# Patient Record
Sex: Female | Born: 1945
Health system: Southern US, Community
[De-identification: ages and names within clinical notes are randomized; demographics above are authoritative.]

## PROBLEM LIST (undated history)

## (undated) DIAGNOSIS — K219 Gastro-esophageal reflux disease without esophagitis: Secondary | ICD-10-CM

## (undated) DIAGNOSIS — F419 Anxiety disorder, unspecified: Secondary | ICD-10-CM

## (undated) DIAGNOSIS — H269 Unspecified cataract: Secondary | ICD-10-CM

## (undated) DIAGNOSIS — F32A Depression, unspecified: Secondary | ICD-10-CM

## (undated) DIAGNOSIS — E785 Hyperlipidemia, unspecified: Secondary | ICD-10-CM

## (undated) DIAGNOSIS — E039 Hypothyroidism, unspecified: Secondary | ICD-10-CM

## (undated) DIAGNOSIS — F329 Major depressive disorder, single episode, unspecified: Secondary | ICD-10-CM

## (undated) HISTORY — PX: ABDOMINAL HYSTERECTOMY: SHX81

## (undated) HISTORY — PX: KNEE SURGERY: SHX244

## (undated) HISTORY — DX: Unspecified cataract: H26.9

## (undated) HISTORY — PX: CATARACT EXTRACTION: SUR2

---

## 2001-07-16 ENCOUNTER — Encounter: Payer: Self-pay | Admitting: Internal Medicine

## 2001-07-16 ENCOUNTER — Ambulatory Visit (HOSPITAL_COMMUNITY): Admission: RE | Admit: 2001-07-16 | Discharge: 2001-07-16 | Payer: Self-pay | Admitting: Internal Medicine

## 2001-12-19 ENCOUNTER — Other Ambulatory Visit: Admission: RE | Admit: 2001-12-19 | Discharge: 2001-12-19 | Payer: Self-pay | Admitting: Internal Medicine

## 2002-08-07 ENCOUNTER — Encounter: Payer: Self-pay | Admitting: Internal Medicine

## 2002-08-07 ENCOUNTER — Ambulatory Visit (HOSPITAL_COMMUNITY): Admission: RE | Admit: 2002-08-07 | Discharge: 2002-08-07 | Payer: Self-pay | Admitting: Internal Medicine

## 2002-08-08 ENCOUNTER — Ambulatory Visit (HOSPITAL_COMMUNITY): Admission: RE | Admit: 2002-08-08 | Discharge: 2002-08-08 | Payer: Self-pay | Admitting: Internal Medicine

## 2005-07-06 ENCOUNTER — Ambulatory Visit (HOSPITAL_COMMUNITY): Admission: RE | Admit: 2005-07-06 | Discharge: 2005-07-06 | Payer: Self-pay | Admitting: Internal Medicine

## 2005-10-18 ENCOUNTER — Emergency Department (HOSPITAL_COMMUNITY): Admission: EM | Admit: 2005-10-18 | Discharge: 2005-10-18 | Payer: Self-pay | Admitting: Emergency Medicine

## 2006-09-14 ENCOUNTER — Ambulatory Visit (HOSPITAL_COMMUNITY): Admission: RE | Admit: 2006-09-14 | Discharge: 2006-09-14 | Payer: Self-pay | Admitting: Internal Medicine

## 2006-10-03 ENCOUNTER — Ambulatory Visit (HOSPITAL_COMMUNITY): Admission: RE | Admit: 2006-10-03 | Discharge: 2006-10-03 | Payer: Self-pay | Admitting: Internal Medicine

## 2008-04-28 ENCOUNTER — Ambulatory Visit (HOSPITAL_COMMUNITY): Admission: RE | Admit: 2008-04-28 | Discharge: 2008-04-28 | Payer: Self-pay | Admitting: Internal Medicine

## 2008-05-04 ENCOUNTER — Ambulatory Visit (HOSPITAL_COMMUNITY): Admission: RE | Admit: 2008-05-04 | Discharge: 2008-05-04 | Payer: Self-pay | Admitting: Internal Medicine

## 2009-02-04 ENCOUNTER — Ambulatory Visit (HOSPITAL_COMMUNITY): Admission: RE | Admit: 2009-02-04 | Discharge: 2009-02-04 | Payer: Self-pay | Admitting: Internal Medicine

## 2009-05-12 ENCOUNTER — Ambulatory Visit (HOSPITAL_COMMUNITY): Admission: RE | Admit: 2009-05-12 | Discharge: 2009-05-12 | Payer: Self-pay | Admitting: Obstetrics and Gynecology

## 2009-06-21 ENCOUNTER — Encounter: Payer: Self-pay | Admitting: Obstetrics and Gynecology

## 2009-06-21 ENCOUNTER — Ambulatory Visit (HOSPITAL_COMMUNITY): Admission: RE | Admit: 2009-06-21 | Discharge: 2009-06-22 | Payer: Self-pay | Admitting: Obstetrics and Gynecology

## 2010-04-06 ENCOUNTER — Ambulatory Visit (HOSPITAL_COMMUNITY): Admission: RE | Admit: 2010-04-06 | Discharge: 2010-04-06 | Payer: Self-pay | Admitting: Obstetrics and Gynecology

## 2010-12-25 ENCOUNTER — Encounter: Payer: Self-pay | Admitting: Internal Medicine

## 2011-03-12 LAB — COMPREHENSIVE METABOLIC PANEL
AST: 24 U/L (ref 0–37)
Alkaline Phosphatase: 50 U/L (ref 39–117)
BUN: 11 mg/dL (ref 6–23)
CO2: 29 mEq/L (ref 19–32)
Calcium: 9.4 mg/dL (ref 8.4–10.5)
Chloride: 102 mEq/L (ref 96–112)
GFR calc Af Amer: >60 mL/min (ref >60)
GFR calc non Af Amer: >60 mL/min (ref >60)
Glucose, Bld: 89 mg/dL (ref 70–99)
Sodium: 138 mEq/L (ref 135–145)

## 2011-03-12 LAB — CBC
HCT: 35.9 % — ABNORMAL LOW (ref 36.0–46.0)
HCT: 37.3 % (ref 36.0–46.0)
Hemoglobin: 13 g/dL (ref 12.0–15.0)
MCV: 90.7 fL (ref 78.0–100.0)
MCV: 91.2 fL (ref 78.0–100.0)
Platelets: 215 10*3/uL (ref 150–400)
RBC: 3.96 MIL/uL (ref 3.87–5.11)
RDW: 12.3 % (ref 11.5–15.5)
WBC: 9.6 10*3/uL (ref 4.0–10.5)

## 2011-03-12 LAB — DIFFERENTIAL
Basophils Relative: 0 % (ref 0–1)
Eosinophils Absolute: 0 10*3/uL (ref 0.0–0.7)
Eosinophils Relative: 0 % (ref 0–5)
Lymphs Abs: 1.3 10*3/uL (ref 0.7–4.0)

## 2011-04-18 NOTE — H&P (Signed)
Tonya, Maldonado              ACCOUNT NO.:  [id number]   MEDICAL RECORD NO.:  [id number]          PATIENT TYPE:  AMB   LOCATION:  DAY                           FACILITY:  [facility]   PHYSICIAN:  Tilda Burrow, M.D. DATE OF BIRTH:  1946/06/01   DATE OF ADMISSION:  DATE OF DISCHARGE:  LH                              HISTORY & PHYSICAL   ADMITTING DIAGNOSES:  1. Persistent endometrial polyp, with benign biopsy.  2. Persistent postmenopausal ovarian cyst with normal CA-125.   HISTORY OF PRESENT ILLNESS:  This 65 year old female is admitted at this  time for laparoscopically-assisted vaginal hysterectomy and bilateral  salpingo-oophorectomy.  She was referred to our office at the courtesy  of Dr. Sherwood Gambler in May, with an evaluation which had included a CT scan in  his office which showed an ovarian cyst and possible uterine mass.  Transvaginal ultrasound showed a uterus 7.7 cm x 3.6 x 4.2 cm with a 1.7  cm area of tissue in the endometrial cavity, distinctly abnormal for  postmenopausal status.  Endometrial biopsy was performed which was  benign obtaining a scanty amount of tissue and had the clinical  impression that there was a polyp inside the uterus on biopsy.  Additionally, she has a ultrasound which shows the small cystic lesion  in the adnexa, consistent with the CT.  CA-125 was obtained which is  very reassuring at 4.1, completely within normal limits.  The patient  was given the options of therapy, including hysteroscopy with resection  of the suspected endometrial polyp and laparoscopic removal of the  ovary.  Additional options were discussed with the patient, including  laparoscopically assisted vaginal hysterectomy and bilateral salpingo-  oophorectomy.  Additionally, the exam was notable for some perineal  laxity.  She does not wish to have this repaired at this time.  Plans  are to proceed with laparoscopic assisted vaginal hysterectomy,  bilateral salpingo-oophorectomy.   The risks of the procedure have been  reviewed in extensive conversations.  A bowel prep will be performed.   PAST MEDICAL HISTORY:  1. Positive for kidney stones, none currently a problem.  2. She has renal cysts.   SURGICAL HISTORY:  1. Tubal ligation 20 years ago through a minilaparotomy.  2. Arthroscopy of the knee.   INJURIES:  None.   MEDICATIONS:  1. Xanax p.r.n. anxiety 1 mg p.r.n.  2. Hydrocodone 5/500 p.r.n. knee pain.  3. Aspirin 81 mg stopped at this time.  4. Pravastatin 40 mg p.o. daily.   ALLERGIES:  ON FURTHER CONVERSATION, SHE ACKNOWLEDGES AN ALLERGY TO  PENICILLIN IN HER 20S.  SHE KNOWS FOR A FACT SHE CAN TAKE AMOXICILLIN  AND KEFLEX.   HABITS:  Cigarettes, alcohol and recreational drugs are denied.   GYN HISTORY:  Notable for vaginal delivery x2.   PHYSICAL EXAMINATION:  VITAL SIGNS:  Height 5'6, weight 175.8, blood  pressure 130/78, pulse 70s.  HEART:  Regular rate and rhythm.  HEENT:  Normocephalic, atraumatic.  Pupils equal, round and reactive.  NECK:  Supple.  Normal thyroid.  CHEST:  Clear to auscultation.  BREASTS:  Deferred.  ABDOMEN:  Nontender.  Moderate obesity present with a midline incision  from tubal ligation.  Uterus has first degree uterine descensus anterior  with normal mobility and moderate laxity of the perineum, not currently  a problem the patient wishes to address.   PLAN:  Laparoscopically assisted vaginal hysterectomy and bilateral  salpingo-oophorectomy on June 21, 2009.      Tilda Burrow, M.D.  Electronically Signed     JVF/MEDQ  D:  06/15/2009  T:  06/16/2009  Job:  [id number]   cc:   [facility]   Madelin Rear. Sherwood Gambler, MD  Fax: [phone]

## 2011-04-18 NOTE — Op Note (Signed)
[name], [name]              ACCOUNT NO.:  [id number]   MEDICAL RECORD NO.:  [id number]          PATIENT TYPE:  INP   LOCATION:  A321                          FACILITY:  [facility]   PHYSICIAN:  Tilda Burrow, M.D. DATE OF BIRTH:  [birth year]   DATE OF PROCEDURE:  06/21/2009  DATE OF DISCHARGE:                               OPERATIVE REPORT   PREOPERATIVE DIAGNOSES:  Endometrial polyp, postmenopausal left ovarian  cyst.   POSTOPERATIVE DIAGNOSES:  Endometrial polyp, postmenopausal left ovarian  cyst, also left lower quadrant large bowel adhesions to left ovarian  cyst.   PROCEDURE:  Laparoscopic-assisted vaginal hysterectomy, bilateral  salpingo-oophorectomy, and lysis of left lower quadrant adhesions.   SURGEON:  Tilda Burrow, M.D.   ASSISTANT:  Morrie Sheldon, RN-FA   ANESTHESIA:  General by Nelda Severe CRNA.   COMPLICATIONS:  None.   FINDINGS:  One 4-cm cyst in the left ovary with epiploic fat adhesions  from the sigmoid colon to the left ovary with additional adhesions to  the anterior abdominal wall.  No evidence of tumor.  Small uterus.  Limited pelvic descent.   INDICATIONS:  A 65 year old postmenopausal female with 4-cm cyst with  normal CA 125, additionally needing removal of an endometrial polyp with  benign endometrial biopsy.  After informed consent, the patient chooses  laparoscopic-assisted vaginal hysterectomy with BSO.   DETAILS OF PROCEDURE:  The patient was taken to the operating room,  prepped and draped for combined abdominovaginal procedure with Foley  catheter in place and the cervix grasped with Hulka tenaculum for  uterine manipulation.  Time-out had been conducted and antibiotics  received preoperatively.  Flowtron leg support is in place for  thromboprophylaxis.  Attention was directed to the umbilicus.  A midline  vertical 1-cm skin incision was made as well as a transverse suprapubic  and right lower quadrant incision.  A Veress needle was introduced  to  the umbilicus, orienting the needle towards the pelvis with water  droplet technique confirming intraperitoneal location and  pneumoperitoneum easily achieved using 3 L CO2.  Inspection of the  pelvis showed no evidence of trauma related to trocar insertion and  normal-appearing fatty tissues in the omentum.  There was no ascites.  The left lower quadrant cyst was attached to the anterior abdominal wall  with some thin adhesions as well as attached to the lateral aspects of  the sigmoid colon.  These retracted off easily.  The cyst itself was  left intact.  The upper abdomen was inspected and the liver edges were  clear and smooth without any abnormalities.  Further documentation  performed.  Attention was then directed to the adhesions.  Suprapubic  trocar was introduced under direct visualization as well as the right  lower quadrant trocar.  Laparoscopic EndoShears were used to shave off a  small bit of peritoneum that were attached to the cyst and then the  epiploic fat appendages were trimmed closely adjacent to the ovarian  cyst and released entirely.  At this time, Trendelenburg position  allowed moving bowel away and pelvis was inspected again.  Surfaces were  smooth.  The LigaSure bipolar cautery device was then used through the  right lower quadrant port to transect the round ligament bilaterally and  expose the infundibulopelvic ligaments bilaterally.  By staying close to  the ovary on either side, we stayed well away from the pelvic sidewall.  The ureters were deep in the fatty tissue of the pelvic sidewall and  could not be directly visualized, but were felt to be safely well away  from the area of surgery.  The tubes and ovaries were isolated by using  the LigaSure and small bites across the broad ligament and freeing up  bilaterally.  Uterine vessels on either side of the lower uterine  segment were then coagulated with LigaSure.  The bladder flap was  developed  anteriorly using EndoShears and abdominal portion of the  procedure considered satisfactorily completed.   After changing position, we began her vaginal portion of the case with  injection of 10 mL of Marcaine with epinephrine around the cervix,  making a posterior colpotomy incision with weighted speculum in place.  The uterosacral ligaments on either sides were clamped, cut, and suture  ligated using Zeppelin clamps, Mayo scissors, and 0 chromic suture  ligature.  Lower quadrant ligaments were taken down in small bites on  each side.  The bladder flap had been developed anteriorly, in which the  bladder retracted upward using a curved Deaver retractor.  The upper  cardinal ligaments were taken down in small bites, clamping, cutting,  and suture ligating and then the board ligament serially clamped, cut,  and suture ligated up to the level of the previous dissection from above  and the pedicles taken off.  The right tube and ovary were left attached  to the uterus.  The large size of the left ovary required [place] to take it  off separately.  The utero-ovarian ligament was clamped, cut, and suture  ligated intact initially, then we were able to gently place traction,  expose the left tube and ovary and take it out carefully with ligation  of the pedicle with good hemostasis.  Inspection of pelvis showed good  hemostasis.  The right uterosacral required an additional suture for  hemostasis.  The bladder flap was reapproximated using running 2-0  chromic across the vaginal cuff and then the remainder of the cuff  closed with a series of interrupted 0 chromic sutures.  Hemostasis was  satisfactory.  Urine remained clear with 325 mL urine output obtained,  with clear characters during the case.  EBL was 125 mL.  Sponge and  needle counts correct.   We then repositioned the patient, changed gowns and drapes for all  surgical members of the team and reinspected the pelvis with  laparoscope.   Ports have been left in place with the abdomen deflated.  Instruments were reinserted, camera reinserted, and we visualized the  pelvis, which was quite hemostatic.  Sponge and needle counts were  correct.  The patient went to recovery room in good condition.      Tilda Burrow, M.D.  Electronically Signed     JVF/MEDQ  D:  06/21/2009  T:  06/22/2009  Job:  [id number]   cc:   Madelin Rear. Sherwood Gambler, MD  Fax: [phone]

## 2011-04-21 NOTE — Op Note (Signed)
   [name], [name]                        ACCOUNT NO.:  [id number]   MEDICAL RECORD NO.:  [id number]                   PATIENT TYPE:  AMB   LOCATION:  DAY                                  FACILITY:  [facility]   PHYSICIAN:  Bernerd Limbo. Leona Carry, M.D.             DATE OF BIRTH:  1946-11-13   DATE OF PROCEDURE:  08/08/2002  DATE OF DISCHARGE:                                 OPERATIVE REPORT   PREOPERATIVE DIAGNOSIS:  Endometrial hyperplasia.   POSTOPERATIVE DIAGNOSIS:  Endometrial hyperplasia.   PROCEDURE:   SURGEON:  Bernerd Limbo. Destefano, M.D.   DESCRIPTION OF PROCEDURE:  Under adequate general anesthesia the patient was  prepped and draped in the lithotomy position.  Bimanual examination revealed  the uterus to be in midposition, probably enlarged to about 3 months size.  Examination of both adnexa were normal.  Rectovaginal was normal. She does  have a weak anterior vaginal wall.   A weighted vaginal speculum was inserted into the vagina and cervix and  grasped with the Jacob's forceps. Uterine wound passed to a depth of about 4  inches.  Cervical canal was dilated and a scant amount of endometrium  obtained.  There was minimal bleeding.  No sponge was left in the vault.  The patient tolerated the procedure nicely and left the operating room in  good condition.                                               Bernerd Limbo. Leona Carry, M.D.    NMD/MEDQ  D:  08/08/2002  T:  08/08/2002  Job:  [id number]

## 2011-04-21 NOTE — H&P (Signed)
   [name], [name]                        ACCOUNT NO.:  [id number]   MEDICAL RECORD NO.:  [id number]                   PATIENT TYPE:  OUT   LOCATION:  RAD                                  FACILITY:  [facility]   PHYSICIAN:  Bernerd Limbo. Leona Carry, M.D.             DATE OF BIRTH:  04-03-1946   DATE OF ADMISSION:  08/07/2002  DATE OF DISCHARGE:                                HISTORY & PHYSICAL   CHIEF COMPLAINT:  This 65 year old white female is admitted to this hospital  for a D&C.   HISTORY OF PRESENT ILLNESS:  She has been in good health.  A recent Pap  smear was essentially normal so far as the cervix was concerned; however,  there were endometrial cells noted which is uncommon in a postmenopausal  female.  She is admitted now for a diagnostic D&C.  There has been no  spotting.  She really has not had a period since 1996.  She is a gravida 2,  para 2, AB 0.   PAST HISTORY:  She had a bilateral tubal ligation in 1982, torn left medial  meniscus of the knee in the past.  She has had a history of kidney stones.   Dictation ended at this point.                                               Bernerd Limbo. Leona Carry, M.D.    NMD/MEDQ  D:  08/07/2002  T:  08/07/2002  Job:  [id number]

## 2011-04-21 NOTE — H&P (Signed)
   NAME:  [name], [name]                      ACCOUNT NO.:  [id number]   MEDICAL RECORD NO.:  [id number]                  PATIENT TYPE:   LOCATION:                                       FACILITY:  [facility]   PHYSICIAN:  [id number]                                DATE OF BIRTH:  07/01/1947   DATE OF ADMISSION:  DATE OF DISCHARGE:                                HISTORY & PHYSICAL   CHIEF COMPLAINT:  This 65 year old white female is admitted to this hospital  for a D&C.   HISTORY OF PRESENT ILLNESS:  This patient had a recent Pap smear.  The  cervical report was within normal limits.  There was the presence of  endometrial cells present.  She is admitted now for a diagnostic D&C.  She  is a gravida 2, para 2, AB 0 and her last menstrual period was in 1996.  She  has had no spotting.  She is admitted as mentioned for diagnostic purposes.   PAST MEDICAL HISTORY:  The patient presently is taking Lipitor 20 mg daily.  She has a history of kidney stones.  She had bilateral tubal ligation in  1982 and torn left medial meniscus in the past.   PHYSICAL EXAMINATION:  GENERAL:  The patient is a healthy 65 year old female  in no acute distress.  VITAL SIGNS:  Blood pressure 120/80, pulse 80 and respirations 20.  HEENT:  Normal.  No jaundice.  NECK:  Supple.  Thyroid not enlarged.  No palpable cervical adenopathy.  BREASTS:  Seem modest size, nipple symmetrical and no mass.  AXILLAE:  Examination of both axilla is normal.  CHEST:  Clear to percussion and auscultation.  HEART:  Regular sinus rhythm.  No thrills or murmurs.  ABDOMEN:  Soft, slightly obese and no areas of muscle guarding or  tenderness.  No visceromegaly.  No operative scars.  GENITALIA:  There is a marital introitus.  Normal Bartholin, urethra and  Skene glands.  Cervix is clean, it points anteriorly.  The body of the  uterus is retroverted.  There is no pain on motion.  Examination of both  adnexa is normal.  Rectovaginal examination  is within normal limits.  BACK:  Normal.   ADMISSION DIAGNOSIS:  Endometrial hyperplasia.   DISPOSITION:  The patient is admitted for a D&C.  The surgery, risks,  complications and consequences were discussed with the patient.  She agrees  with surgery.  She has been scheduled for September 5.     Bernerd Limbo. Leona Carry, M.D.                   [place]    NMD/MEDQ  D:  08/07/2002  T:  08/07/2002  Job:  [id number]

## 2011-05-16 ENCOUNTER — Other Ambulatory Visit: Payer: Self-pay | Admitting: Anesthesiology

## 2011-05-16 ENCOUNTER — Encounter (HOSPITAL_COMMUNITY): Payer: BC Managed Care – PPO

## 2011-05-16 LAB — BASIC METABOLIC PANEL
BUN: 9 mg/dL (ref 6–23)
Calcium: 9.7 mg/dL (ref 8.4–10.5)
Chloride: 104 mEq/L (ref 96–112)
Creatinine, Ser: 0.67 mg/dL (ref 0.4–1.2)
GFR calc Af Amer: >60 mL/min (ref >60)
GFR calc non Af Amer: >60 mL/min (ref >60)
Potassium: 4.4 mEq/L (ref 3.5–5.1)

## 2011-05-16 LAB — HEMOGLOBIN AND HEMATOCRIT, BLOOD: Hemoglobin: 12.9 g/dL (ref 12.0–15.0)

## 2011-05-22 ENCOUNTER — Ambulatory Visit (HOSPITAL_COMMUNITY)
Admission: RE | Admit: 2011-05-22 | Discharge: 2011-05-22 | Disposition: A | Discharge status: Home / Self Care | Payer: BC Managed Care – PPO | Source: Ambulatory Visit | Attending: Ophthalmology | Admitting: Ophthalmology

## 2011-05-22 DIAGNOSIS — Z01812 Encounter for preprocedural laboratory examination: Secondary | ICD-10-CM | POA: Insufficient documentation

## 2011-05-22 DIAGNOSIS — H2510 Age-related nuclear cataract, unspecified eye: Secondary | ICD-10-CM | POA: Insufficient documentation

## 2011-05-22 DIAGNOSIS — Z01810 Encounter for preprocedural cardiovascular examination: Secondary | ICD-10-CM | POA: Insufficient documentation

## 2011-05-22 DIAGNOSIS — Z7982 Long term (current) use of aspirin: Secondary | ICD-10-CM | POA: Insufficient documentation

## 2011-08-09 ENCOUNTER — Emergency Department (HOSPITAL_COMMUNITY): Payer: Medicare Other

## 2011-08-09 ENCOUNTER — Encounter: Payer: Self-pay | Admitting: *Deleted

## 2011-08-09 ENCOUNTER — Emergency Department (HOSPITAL_COMMUNITY)
Admission: EM | Admit: 2011-08-09 | Discharge: 2011-08-09 | Disposition: A | Discharge status: Home / Self Care | Payer: Medicare Other | Attending: Emergency Medicine | Admitting: Emergency Medicine

## 2011-08-09 DIAGNOSIS — IMO0002 Reserved for concepts with insufficient information to code with codable children: Secondary | ICD-10-CM | POA: Insufficient documentation

## 2011-08-09 DIAGNOSIS — M25569 Pain in unspecified knee: Secondary | ICD-10-CM | POA: Insufficient documentation

## 2011-08-09 DIAGNOSIS — S86919A Strain of unspecified muscle(s) and tendon(s) at lower leg level, unspecified leg, initial encounter: Secondary | ICD-10-CM

## 2011-08-09 DIAGNOSIS — M25469 Effusion, unspecified knee: Secondary | ICD-10-CM | POA: Insufficient documentation

## 2011-08-09 DIAGNOSIS — Y92009 Unspecified place in unspecified non-institutional (private) residence as the place of occurrence of the external cause: Secondary | ICD-10-CM | POA: Insufficient documentation

## 2011-08-09 HISTORY — DX: Hyperlipidemia, unspecified: E78.5

## 2011-08-09 MED ORDER — HYDROCODONE-ACETAMINOPHEN 5-325 MG PO TABS
2.0000 | ORAL_TABLET | Freq: Once | ORAL | Status: AC
  Administered 2011-08-09: 2 via ORAL
  Filled 2011-08-09: qty 2

## 2011-08-09 MED ORDER — ONDANSETRON HCL 4 MG PO TABS
4.0000 mg | ORAL_TABLET | Freq: Once | ORAL | Status: AC
  Administered 2011-08-09: 4 mg via ORAL
  Filled 2011-08-09: qty 1

## 2011-08-09 NOTE — ED Notes (Signed)
Pt a/ox4. Resp even and unlabored. NAD at this time. D/C instructions reviewed with pt. Pt verbalized understanding. Pt to POV via w/c.

## 2011-08-09 NOTE — ED Provider Notes (Signed)
Medical screening examination/treatment/procedure(s) were performed by non-physician practitioner and as supervising physician I was immediately available for consultation/collaboration.   Graviel Payeur M Milt Coye, MD 08/09/11 2350 

## 2011-08-09 NOTE — ED Provider Notes (Signed)
History     CSN: [id number] Arrival date & time: 08/09/2011  7:43 PM  Chief Complaint  Patient presents with  . Knee Pain   HPI Comments: Pt states her dog hit her in the side of the right knee. She heard a pop and fell. Since that time, she can not apply weight to the right knee. She had a "ligament" surg. In the 1980's.  Patient is a 65 y.o. female presenting with knee pain. The history is provided by the patient.  Knee Pain This is a new problem. The current episode started today. The problem occurs constantly. The problem has been gradually worsening. Pertinent negatives include no abdominal pain, arthralgias, chest pain, coughing or neck pain.    Past Medical History  Diagnosis Date  . Hyperlipidemia     Past Surgical History  Procedure Date  . Abdominal hysterectomy   . Cataract extraction   . Knee surgery     History reviewed. No pertinent family history.  History  Substance Use Topics  . Smoking status: Never Smoker   . Smokeless tobacco: Not on file  . Alcohol Use: No    OB History    Grav Para Term Preterm Abortions TAB SAB Ect Mult Living                  Review of Systems  Constitutional: Negative for activity change.       All ROS Neg except as noted in HPI  HENT: Negative for nosebleeds and neck pain.   Eyes: Negative for photophobia and discharge.  Respiratory: Negative for cough, shortness of breath and wheezing.   Cardiovascular: Negative for chest pain and palpitations.  Gastrointestinal: Negative for abdominal pain and blood in stool.  Genitourinary: Negative for dysuria, frequency and hematuria.  Musculoskeletal: Negative for back pain and arthralgias.  Skin: Negative.   Neurological: Negative for dizziness, seizures and speech difficulty.  Psychiatric/Behavioral: Negative for hallucinations and confusion.    Physical Exam  BP 146/90  Pulse 97  Temp(Src) 98.5 F (36.9 C) (Oral)  Resp 16  Ht 5\' 6"  (1.676 m)  Wt 174 lb (78.926 kg)  BMI  28.08 kg/m2  SpO2 96%  Physical Exam  Nursing note and vitals reviewed. Constitutional: She is oriented to person, place, and time. She appears well-developed and well-nourished.  Non-toxic appearance.  HENT:  Head: Normocephalic.  Right Ear: Tympanic membrane and external ear normal.  Left Ear: Tympanic membrane and external ear normal.  Eyes: EOM and lids are normal. Pupils are equal, round, and reactive to light.  Neck: Normal range of motion. Neck supple. Carotid bruit is not present.  Cardiovascular: Normal rate, regular rhythm, normal heart sounds, intact distal pulses and normal pulses.   Pulmonary/Chest: Breath sounds normal. No respiratory distress.  Abdominal: Soft. Bowel sounds are normal. There is no tenderness. There is no guarding.  Musculoskeletal: Normal range of motion.       Pt has a mild effusion of the right knee. Pain to movement. Not hot. No posterior mass. Distal pulse and sensory wnl. Good cap refill. Achilles intact.  Lymphadenopathy:       Head (right side): No submandibular adenopathy present.       Head (left side): No submandibular adenopathy present.    She has no cervical adenopathy.  Neurological: She is alert and oriented to person, place, and time. She has normal strength. No cranial nerve deficit or sensory deficit.  Skin: Skin is warm and dry.  Psychiatric: She has a  normal mood and affect. Her speech is normal.    ED Course: Pt to apply ice, elevated right leg. She has pain meds at home. She is to see Dr Romeo Apple for evaluation if not improving in 5 days. Crutches and knee immobilizer applied by nursing staff.  Procedures  MDM I have reviewed nursing notes, vital signs, and all appropriate lab and imaging results for this patient.   Results for orders placed in visit on 05/16/11  BASIC METABOLIC PANEL      Component Value Range   Sodium 140  135 - 145 (mEq/L)   Potassium 4.4  3.5 - 5.1 (mEq/L)   Chloride 104  96 - 112 (mEq/L)   CO2 29  19 -  32 (mEq/L)   Glucose, Bld 90  70 - 99 (mg/dL)   BUN 9  6 - 23 (mg/dL)   Creatinine, Ser 4.09  0.4 - 1.2 (mg/dL)   Calcium 9.7  8.4 - 81.1 (mg/dL)   GFR calc non Af Amer >60  >60 (mL/min)   GFR calc Af Amer >60  >60 (mL/min)   Dg Knee 4 Views W/patella Right  08/09/2011  *RADIOLOGY REPORT*  Clinical Data: Pain post fall.  RIGHT KNEE - COMPLETE 4+ VIEW  Comparison: None.  Findings: Small effusion in the suprapatellar bursa.  Small marginal spurs about the lateral and patellofemoral compartments. No definite fracture.  The tibial plateau is not well profiled on the AP projection however.  IMPRESSION:  1.  Negative for fracture or other acute bony abnormality. 2.  Patellofemoral and lateral compartment degenerative spurring. 3.  Small knee effusion.  Original Report Authenticated By: Osa Craver, M.D.      Kathie Dike, [place] 08/09/11 2237

## 2011-08-09 NOTE — ED Notes (Signed)
Knee Immobilizer applied to right knee and crutches teaching given. Pt verbalized understanding.

## 2011-08-09 NOTE — ED Notes (Signed)
Pt was walking through yard and her dog ran through and hit her knee. Pt states she has had a knee surgery in the past on the same knee.

## 2013-09-18 ENCOUNTER — Other Ambulatory Visit: Payer: Self-pay | Admitting: Obstetrics and Gynecology

## 2013-10-13 ENCOUNTER — Ambulatory Visit (INDEPENDENT_AMBULATORY_CARE_PROVIDER_SITE_OTHER): Payer: Medicare Other | Admitting: Obstetrics and Gynecology

## 2013-10-13 ENCOUNTER — Encounter: Payer: Self-pay | Admitting: Obstetrics and Gynecology

## 2013-10-13 VITALS — BP 140/80 | Ht 64.0 in | Wt 170.2 lb

## 2013-10-13 DIAGNOSIS — E782 Mixed hyperlipidemia: Secondary | ICD-10-CM

## 2013-10-13 DIAGNOSIS — Z1329 Encounter for screening for other suspected endocrine disorder: Secondary | ICD-10-CM

## 2013-10-13 DIAGNOSIS — Z0000 Encounter for general adult medical examination without abnormal findings: Secondary | ICD-10-CM

## 2013-10-13 DIAGNOSIS — Z1212 Encounter for screening for malignant neoplasm of rectum: Secondary | ICD-10-CM

## 2013-10-13 DIAGNOSIS — Z13220 Encounter for screening for lipoid disorders: Secondary | ICD-10-CM

## 2013-10-13 DIAGNOSIS — I10 Essential (primary) hypertension: Secondary | ICD-10-CM

## 2013-10-13 DIAGNOSIS — E785 Hyperlipidemia, unspecified: Secondary | ICD-10-CM

## 2013-10-13 DIAGNOSIS — Z01419 Encounter for gynecological examination (general) (routine) without abnormal findings: Secondary | ICD-10-CM

## 2013-10-13 LAB — CBC
HCT: 39.5 % (ref 36.0–46.0)
MCH: 30.4 pg (ref 26.0–34.0)
MCHC: 34.4 g/dL (ref 30.0–36.0)
Platelets: 241 10*3/uL (ref 150–400)
RDW: 13.6 % (ref 11.5–15.5)

## 2013-10-13 LAB — HEMOCCULT GUIAC POC 1CARD (OFFICE): Fecal Occult Blood, POC: NEGATIVE

## 2013-10-13 NOTE — Addendum Note (Signed)
Addended by: Criss Alvine on: 10/13/2013 09:31 AM   Modules accepted: Orders

## 2013-10-13 NOTE — Patient Instructions (Signed)
Labs today, May get a copy on line thru [facility]

## 2013-10-13 NOTE — Progress Notes (Signed)
  Assessment:  Annual Gyn Exam, s/p LAVH, BSO 2012 for endo polyp, and ovarian cyst.   Plan:  1. return  triannually or prn [facility] to check hemoccult alternate yrs. 3    Annual mammogram advised 2. hemoccult Subjective:  [name] is a 67 y.o. female No obstetric history on file. who presents for annual exam. No LMP recorded. Patient has had a hysterectomy.  Primary care [facility]. Desires labs. C/o cramps  The patient has complaints today of Am cramping 5 am, while on pravastatin, d/c'd med , having cramps still, but less.  The following portions of the patient's history were reviewed and updated as appropriate: allergies, current medications, past family history, past medical history, past social history, past surgical history and problem list.  Review of Systems Constitutional: negative, cramps Gastrointestinal: negative, occasional constipation , uses correctol/ dulcolax Genitourinary: minimal sui with lift , cough  Wears pantiliner Objective:  BP 140/80  Ht 5\' 4"  (1.626 m)  Wt 170 lb 3.2 oz (77.202 kg)  BMI 29.20 kg/m2   BMI: Body mass index is 29.2 kg/(m^2).  General Appearance: Alert, appropriate appearance for age. No acute distress HEENT: Grossly normal Neck / Thyroid:  Cardiovascular: RRR; normal S1, S2, no murmur Lungs: CTA bilaterally Back: No CVAT Breast Exam: No dimpling, nipple retraction or discharge. No masses or nodes., axilla negative, checked carefully as per pt request and No masses or nodes.No dimpling, nipple retraction or discharge. Gastrointestinal: Soft, non-tender, no masses or organomegaly Pelvic Exam: External genitalia: normal general appearance Vaginal: normal mucosa without prolapse or lesions and atrophic mucosa Cervix: absent Uterus: absent Rectovaginal: normal rectal, no masses and guaiac negative stool obtained Lymphatic Exam: Non-palpable nodes in neck, clavicular, axillary, or inguinal regions Skin: no rash or abnormalities Neurologic:  Normal gait and speech, no tremor  Psychiatric: Alert and oriented, appropriate affect.  Urinalysis:Not done  Christin Bach. MD Pgr [phone] 8:59 AM

## 2013-10-14 ENCOUNTER — Telehealth: Payer: Self-pay | Admitting: Obstetrics and Gynecology

## 2013-10-14 LAB — TSH: TSH: 3.178 u[IU]/mL (ref 0.350–4.500)

## 2013-10-14 LAB — LIPID PANEL
HDL: 78 mg/dL (ref >39)
LDL Cholesterol: 122 mg/dL — ABNORMAL HIGH (ref 0–99)
Triglycerides: 101 mg/dL (ref <150)
VLDL: 20 mg/dL (ref 0–40)

## 2013-10-14 LAB — COMPREHENSIVE METABOLIC PANEL
ALT: 17 U/L (ref 0–35)
AST: 21 U/L (ref 0–37)
Albumin: 4.3 g/dL (ref 3.5–5.2)
Alkaline Phosphatase: 50 U/L (ref 39–117)
BUN: 11 mg/dL (ref 6–23)
Chloride: 101 mEq/L (ref 96–112)
Glucose, Bld: 85 mg/dL (ref 70–99)
Potassium: 4.1 mEq/L (ref 3.5–5.3)
Sodium: 138 mEq/L (ref 135–145)
Total Bilirubin: 0.6 mg/dL (ref 0.3–1.2)
Total Protein: 6.9 g/dL (ref 6.0–8.3)

## 2013-10-14 NOTE — Telephone Encounter (Signed)
PT INformed of labs.  Excellent HDL, slight ele vation of LDL. Pt aware. F/u 3 yr

## 2013-10-15 ENCOUNTER — Telehealth: Payer: Self-pay | Admitting: Obstetrics and Gynecology

## 2013-10-16 NOTE — Telephone Encounter (Signed)
Spoke with [name]. Discussed lab results. [name] stated Dr. Emelda Fear advised to increase exercise and decrease salt intake. [name] wanted copy of results to give to primary Dr. Cheri Kearns of results printed and sent to front desk for [name] to pick up tomorrow. [name] voiced understanding. JSY

## 2014-10-14 ENCOUNTER — Other Ambulatory Visit: Payer: Medicare Other | Admitting: Obstetrics and Gynecology

## 2014-11-11 ENCOUNTER — Other Ambulatory Visit: Payer: Medicare Other | Admitting: Obstetrics and Gynecology

## 2014-11-17 ENCOUNTER — Other Ambulatory Visit: Payer: Medicare Other | Admitting: Obstetrics and Gynecology

## 2015-03-02 DIAGNOSIS — Z6829 Body mass index (BMI) 29.0-29.9, adult: Secondary | ICD-10-CM | POA: Diagnosis not present

## 2015-03-02 DIAGNOSIS — F419 Anxiety disorder, unspecified: Secondary | ICD-10-CM | POA: Diagnosis not present

## 2015-03-02 DIAGNOSIS — E039 Hypothyroidism, unspecified: Secondary | ICD-10-CM | POA: Diagnosis not present

## 2015-03-02 DIAGNOSIS — G894 Chronic pain syndrome: Secondary | ICD-10-CM | POA: Diagnosis not present

## 2015-03-02 DIAGNOSIS — E063 Autoimmune thyroiditis: Secondary | ICD-10-CM | POA: Diagnosis not present

## 2015-03-03 ENCOUNTER — Ambulatory Visit: Payer: Commercial Managed Care - HMO | Admitting: Obstetrics and Gynecology

## 2015-03-03 ENCOUNTER — Encounter: Payer: Self-pay | Admitting: Obstetrics and Gynecology

## 2015-03-08 NOTE — Progress Notes (Signed)
Pt no show for appt.

## 2015-03-12 ENCOUNTER — Encounter (INDEPENDENT_AMBULATORY_CARE_PROVIDER_SITE_OTHER): Payer: Self-pay | Admitting: *Deleted

## 2015-04-15 DIAGNOSIS — R5383 Other fatigue: Secondary | ICD-10-CM | POA: Diagnosis not present

## 2015-06-03 ENCOUNTER — Ambulatory Visit: Payer: Medicare Other | Admitting: Obstetrics and Gynecology

## 2015-06-03 DIAGNOSIS — E663 Overweight: Secondary | ICD-10-CM | POA: Diagnosis not present

## 2015-06-03 DIAGNOSIS — L918 Other hypertrophic disorders of the skin: Secondary | ICD-10-CM | POA: Diagnosis not present

## 2015-06-03 DIAGNOSIS — Z6828 Body mass index (BMI) 28.0-28.9, adult: Secondary | ICD-10-CM | POA: Diagnosis not present

## 2015-06-03 DIAGNOSIS — Z1389 Encounter for screening for other disorder: Secondary | ICD-10-CM | POA: Diagnosis not present

## 2015-06-03 DIAGNOSIS — K644 Residual hemorrhoidal skin tags: Secondary | ICD-10-CM | POA: Diagnosis not present

## 2015-06-03 DIAGNOSIS — K219 Gastro-esophageal reflux disease without esophagitis: Secondary | ICD-10-CM | POA: Diagnosis not present

## 2015-06-09 ENCOUNTER — Other Ambulatory Visit (HOSPITAL_COMMUNITY): Payer: Self-pay | Admitting: Internal Medicine

## 2015-06-09 DIAGNOSIS — Z1231 Encounter for screening mammogram for malignant neoplasm of breast: Secondary | ICD-10-CM

## 2015-06-14 ENCOUNTER — Ambulatory Visit (HOSPITAL_COMMUNITY)
Admission: RE | Admit: 2015-06-14 | Discharge: 2015-06-14 | Disposition: A | Discharge status: Home / Self Care | Payer: Commercial Managed Care - HMO | Source: Ambulatory Visit | Attending: Internal Medicine | Admitting: Internal Medicine

## 2015-06-14 DIAGNOSIS — Z1231 Encounter for screening mammogram for malignant neoplasm of breast: Secondary | ICD-10-CM | POA: Insufficient documentation

## 2015-06-16 ENCOUNTER — Other Ambulatory Visit (INDEPENDENT_AMBULATORY_CARE_PROVIDER_SITE_OTHER): Payer: Self-pay | Admitting: *Deleted

## 2015-06-16 ENCOUNTER — Encounter (INDEPENDENT_AMBULATORY_CARE_PROVIDER_SITE_OTHER): Payer: Self-pay | Admitting: *Deleted

## 2015-06-16 DIAGNOSIS — Z1211 Encounter for screening for malignant neoplasm of colon: Secondary | ICD-10-CM

## 2015-06-18 DIAGNOSIS — E559 Vitamin D deficiency, unspecified: Secondary | ICD-10-CM | POA: Diagnosis not present

## 2015-06-23 DIAGNOSIS — Z1211 Encounter for screening for malignant neoplasm of colon: Secondary | ICD-10-CM | POA: Diagnosis not present

## 2015-07-05 ENCOUNTER — Telehealth (INDEPENDENT_AMBULATORY_CARE_PROVIDER_SITE_OTHER): Payer: Self-pay | Admitting: *Deleted

## 2015-07-05 NOTE — Telephone Encounter (Signed)
Patient needs suprep 

## 2015-07-06 MED ORDER — SUPREP BOWEL PREP KIT 17.5-3.13-1.6 GM/177ML PO SOLN: 1.0000 | Freq: Once | ORAL | Status: DC

## 2015-07-19 DIAGNOSIS — K219 Gastro-esophageal reflux disease without esophagitis: Secondary | ICD-10-CM | POA: Diagnosis not present

## 2015-07-19 DIAGNOSIS — F419 Anxiety disorder, unspecified: Secondary | ICD-10-CM | POA: Diagnosis not present

## 2015-07-19 DIAGNOSIS — Z1389 Encounter for screening for other disorder: Secondary | ICD-10-CM | POA: Diagnosis not present

## 2015-07-19 DIAGNOSIS — Z6829 Body mass index (BMI) 29.0-29.9, adult: Secondary | ICD-10-CM | POA: Diagnosis not present

## 2015-07-19 DIAGNOSIS — M1991 Primary osteoarthritis, unspecified site: Secondary | ICD-10-CM | POA: Diagnosis not present

## 2015-07-19 DIAGNOSIS — G894 Chronic pain syndrome: Secondary | ICD-10-CM | POA: Diagnosis not present

## 2015-07-19 DIAGNOSIS — E663 Overweight: Secondary | ICD-10-CM | POA: Diagnosis not present

## 2015-08-03 ENCOUNTER — Telehealth (INDEPENDENT_AMBULATORY_CARE_PROVIDER_SITE_OTHER): Payer: Self-pay | Admitting: *Deleted

## 2015-08-03 NOTE — Telephone Encounter (Signed)
Referring MD/PCP: fusco   Procedure: tcs  Reason/Indication:  screening  Has patient had this procedure before?  no  If so, when, by whom and where?    Is there a family history of colon cancer?  no  Who?  What age when diagnosed?    Is patient diabetic?   no      Does patient have prosthetic heart valve?  no  Do you have a pacemaker?  no  Has patient ever had endocarditis? no  Has patient had joint replacement within last 12 months?  no  Does patient tend to be constipated or take laxatives? Sometime  Does patient have a history of alcohol/drug use?   Is patient on Coumadin, Plavix and/or Aspirin? yes  Medications: asa 81 mg daily, levothyroxine 25 mg daily, alprazolam 1 mg prn, hydrocodone 10/325 mg tid, biotin 5000 mg daily, omeprazole 20 mg daily, vit b12 daily, calcium   Allergies: see epic  Medication Adjustment: asa 2 days  Procedure date & time: 09/02/15 at 930

## 2015-08-05 NOTE — Telephone Encounter (Signed)
agree

## 2015-08-12 DIAGNOSIS — H5210 Myopia, unspecified eye: Secondary | ICD-10-CM | POA: Diagnosis not present

## 2015-08-12 DIAGNOSIS — H524 Presbyopia: Secondary | ICD-10-CM | POA: Diagnosis not present

## 2015-09-02 ENCOUNTER — Encounter (HOSPITAL_COMMUNITY): Payer: Self-pay | Admitting: *Deleted

## 2015-09-02 ENCOUNTER — Encounter (HOSPITAL_COMMUNITY)
Admission: RE | Disposition: A | Discharge status: Home / Self Care | Payer: Self-pay | Source: Ambulatory Visit | Attending: Internal Medicine

## 2015-09-02 ENCOUNTER — Ambulatory Visit (HOSPITAL_COMMUNITY)
Admission: RE | Admit: 2015-09-02 | Discharge: 2015-09-02 | Disposition: A | Discharge status: Home / Self Care | Payer: Commercial Managed Care - HMO | Source: Ambulatory Visit | Attending: Internal Medicine | Admitting: Internal Medicine

## 2015-09-02 DIAGNOSIS — K644 Residual hemorrhoidal skin tags: Secondary | ICD-10-CM | POA: Insufficient documentation

## 2015-09-02 DIAGNOSIS — F418 Other specified anxiety disorders: Secondary | ICD-10-CM | POA: Insufficient documentation

## 2015-09-02 DIAGNOSIS — E785 Hyperlipidemia, unspecified: Secondary | ICD-10-CM | POA: Insufficient documentation

## 2015-09-02 DIAGNOSIS — D123 Benign neoplasm of transverse colon: Secondary | ICD-10-CM | POA: Insufficient documentation

## 2015-09-02 DIAGNOSIS — Z1211 Encounter for screening for malignant neoplasm of colon: Secondary | ICD-10-CM | POA: Diagnosis not present

## 2015-09-02 DIAGNOSIS — D128 Benign neoplasm of rectum: Secondary | ICD-10-CM | POA: Diagnosis not present

## 2015-09-02 DIAGNOSIS — K219 Gastro-esophageal reflux disease without esophagitis: Secondary | ICD-10-CM | POA: Diagnosis not present

## 2015-09-02 DIAGNOSIS — K621 Rectal polyp: Secondary | ICD-10-CM | POA: Diagnosis not present

## 2015-09-02 DIAGNOSIS — E039 Hypothyroidism, unspecified: Secondary | ICD-10-CM | POA: Insufficient documentation

## 2015-09-02 DIAGNOSIS — K5730 Diverticulosis of large intestine without perforation or abscess without bleeding: Secondary | ICD-10-CM | POA: Insufficient documentation

## 2015-09-02 DIAGNOSIS — Z79899 Other long term (current) drug therapy: Secondary | ICD-10-CM | POA: Insufficient documentation

## 2015-09-02 HISTORY — DX: Hypothyroidism, unspecified: E03.9

## 2015-09-02 HISTORY — DX: Depression, unspecified: F32.A

## 2015-09-02 HISTORY — DX: Gastro-esophageal reflux disease without esophagitis: K21.9

## 2015-09-02 HISTORY — DX: Anxiety disorder, unspecified: F41.9

## 2015-09-02 HISTORY — DX: Major depressive disorder, single episode, unspecified: F32.9

## 2015-09-02 HISTORY — PX: COLONOSCOPY: SHX5424

## 2015-09-02 SURGERY — COLONOSCOPY
Anesthesia: Moderate Sedation

## 2015-09-02 MED ORDER — PROMETHAZINE HCL 25 MG/ML IJ SOLN
INTRAMUSCULAR | Status: AC
  Filled 2015-09-02: qty 1

## 2015-09-02 MED ORDER — MEPERIDINE HCL 50 MG/ML IJ SOLN
INTRAMUSCULAR | Status: AC
  Filled 2015-09-02: qty 1

## 2015-09-02 MED ORDER — MIDAZOLAM HCL 5 MG/5ML IJ SOLN
INTRAMUSCULAR | Status: DC | PRN
  Administered 2015-09-02: 2 mg via INTRAVENOUS
  Administered 2015-09-02: 3 mg via INTRAVENOUS
  Administered 2015-09-02: 2 mg via INTRAVENOUS
  Administered 2015-09-02: 3 mg via INTRAVENOUS

## 2015-09-02 MED ORDER — MEPERIDINE HCL 50 MG/ML IJ SOLN
INTRAMUSCULAR | Status: DC | PRN
  Administered 2015-09-02 (×2): 25 mg via INTRAVENOUS

## 2015-09-02 MED ORDER — SODIUM CHLORIDE 0.9 % IV SOLN
INTRAVENOUS | Status: DC
  Administered 2015-09-02: 09:00:00 via INTRAVENOUS

## 2015-09-02 MED ORDER — STERILE WATER FOR IRRIGATION IR SOLN
Status: DC | PRN
  Administered 2015-09-02: 09:00:00

## 2015-09-02 MED ORDER — MIDAZOLAM HCL 5 MG/5ML IJ SOLN
INTRAMUSCULAR | Status: AC
  Filled 2015-09-02: qty 10

## 2015-09-02 MED ORDER — SODIUM CHLORIDE 0.9 % IJ SOLN
INTRAMUSCULAR | Status: AC
  Filled 2015-09-02: qty 3

## 2015-09-02 MED ORDER — PROMETHAZINE HCL 25 MG/ML IJ SOLN
INTRAMUSCULAR | Status: DC | PRN
  Administered 2015-09-02: 12.5 mg via INTRAVENOUS

## 2015-09-02 NOTE — Op Note (Signed)
COLONOSCOPY PROCEDURE REPORT  PATIENT:  [name]  MR#:  [id number] Birthdate:  12-22-1945, 70 y.o., female Endoscopist:  Dr. Rogene Houston, MD Referred By:  Dr. Glo Herring, MD Procedure Date: 09/02/2015  Procedure:   Colonoscopy  Indications:  Patient is 69 year old Caucasian female was undergoing average risk screening colonoscopy.  Informed Consent:  The procedure and risks were reviewed with the patient and informed consent was obtained.  Medications:  Demerol 50 mg IV Versed 10 mg IV Promethazine 12.5 mg IV and diluted form  Description of procedure:  After a digital rectal exam was performed, that colonoscope was advanced from the anus through the rectum and colon to the area of the cecum, ileocecal valve and appendiceal orifice. The cecum was deeply intubated. These structures were well-seen and photographed for the record. From the level of the cecum and ileocecal valve, the scope was slowly and cautiously withdrawn. The mucosal surfaces were carefully surveyed utilizing scope tip to flexion to facilitate fold flattening as needed. The scope was pulled down into the rectum where a thorough exam including retroflexion was performed.  Findings:   Prep satisfactory. Small polyps were cold snared from hepatic flexure and submitted together. These measured 4 and 5 mm each. Moderate number of diverticula at sigmoid colon. Two small polyps are cold snared from distal rectum. One polyp was lost. Mall hemorrhoids below the dentate line.   Therapeutic/Diagnostic Maneuvers Performed:  See above  Complications:  None  EBL: None  Cecal Withdrawal Time:  19 minutes  Impression:  Examination performed to cecum. Two small polyps were cold snared from hepatic flexure and submitted together. Moderate sigmoid colon diverticulosis. Two small rectal polyps were cold snared and one was lost. Small external hemorrhoids.  Recommendations:  Standard instructions given. High  fiber diet I will contact patient with biopsy results and further recommendations.  REHMAN,NAJEEB U  09/02/2015 10:07 AM  CC: Dr. Glo Herring., MD & Dr. Rayne Du ref. provider found

## 2015-09-02 NOTE — Discharge Instructions (Signed)
Resume usual medications. High fiber diet. No driving for 24 hours. Patient will call with biopsy results.      Colonoscopy, Care After These instructions give you information on caring for yourself after your procedure. Your doctor may also give you more specific instructions. Call your doctor if you have any problems or questions after your procedure. HOME CARE  Do not drive for 24 hours.  Do not sign important papers or use machinery for 24 hours.  You may shower.  You may go back to your usual activities, but go slower for the first 24 hours.  Take rest breaks often during the first 24 hours.  Walk around or use warm packs on your belly (abdomen) if you have belly cramping or gas.  Drink enough fluids to keep your pee (urine) clear or pale yellow.  Resume your normal diet. Avoid heavy or fried foods.  Avoid drinking alcohol for 24 hours or as told by your doctor.  Only take medicines as told by your doctor. If a tissue sample (biopsy) was taken during the procedure:   Do not take aspirin or blood thinners for 7 days, or as told by your doctor.  Do not drink alcohol for 7 days, or as told by your doctor.  Eat soft foods for the first 24 hours. GET HELP IF: You still have a small amount of blood in your poop (stool) 2-3 days after the procedure. GET HELP RIGHT AWAY IF:  You have more than a small amount of blood in your poop.  You see clumps of tissue (blood clots) in your poop.  Your belly is puffy (swollen).  You feel sick to your stomach (nauseous) or throw up (vomit).  You have a fever.  You have belly pain that gets worse and medicine does not help. MAKE SURE YOU:  Understand these instructions.  Will watch your condition.  Will get help right away if you are not doing well or get worse. Document Released: 12/23/2010 Document Revised: 11/25/2013 Document Reviewed: 07/28/2013 [facility] Patient Information 2015 [place], [place]. This information is not  intended to replace advice given to you by your health care provider. Make sure you discuss any questions you have with your health care provider.   Diverticulosis Diverticulosis is the condition that develops when small pouches (diverticula) form in the wall of your colon. Your colon, or large intestine, is where water is absorbed and stool is formed. The pouches form when the inside layer of your colon pushes through weak spots in the outer layers of your colon. CAUSES  No one knows exactly what causes diverticulosis. RISK FACTORS  Being older than 77. Your risk for this condition increases with age. Diverticulosis is rare in people younger than 40 years. By age 48, almost everyone has it.  Eating a low-fiber diet.  Being frequently constipated.  Being overweight.  Not getting enough exercise.  Smoking.  Taking over-the-counter pain medicines, like aspirin and ibuprofen. SYMPTOMS  Most people with diverticulosis do not have symptoms. DIAGNOSIS  Because diverticulosis often has no symptoms, health care providers often discover the condition during an exam for other colon problems. In many cases, a health care provider will diagnose diverticulosis while using a flexible scope to examine the colon (colonoscopy). TREATMENT  If you have never developed an infection related to diverticulosis, you may not need treatment. If you have had an infection before, treatment may include:  Eating more fruits, vegetables, and grains.  Taking a fiber supplement.  Taking a live  bacteria supplement (probiotic).  Taking medicine to relax your colon. HOME CARE INSTRUCTIONS   Drink at least 6-8 glasses of water each day to prevent constipation.  Try not to strain when you have a bowel movement.  Keep all follow-up appointments. If you have had an infection before:  Increase the fiber in your diet as directed by your health care provider or dietitian.  Take a dietary fiber supplement if  your health care provider approves.  Only take medicines as directed by your health care provider. SEEK MEDICAL CARE IF:   You have abdominal pain.  You have bloating.  You have cramps.  You have not gone to the bathroom in 3 days. SEEK IMMEDIATE MEDICAL CARE IF:   Your pain gets worse.  Yourbloating becomes very bad.  You have a fever or chills, and your symptoms suddenly get worse.  You begin vomiting.  You have bowel movements that are bloody or black. MAKE SURE YOU:  Understand these instructions.  Will watch your condition.  Will get help right away if you are not doing well or get worse. Document Released: 08/17/2004 Document Revised: 11/25/2013 Document Reviewed: 10/15/2013 [facility] Patient Information 2015 [place], [place]. This information is not intended to replace advice given to you by your health care provider. Make sure you discuss any questions you have with your health care provider.  Colon Polyps Polyps are lumps of extra tissue growing inside the body. Polyps can grow in the large intestine (colon). Most colon polyps are noncancerous (benign). However, some colon polyps can become cancerous over time. Polyps that are larger than a pea may be harmful. To be safe, caregivers remove and test all polyps. CAUSES  Polyps form when mutations in the genes cause your cells to grow and divide even though no more tissue is needed. RISK FACTORS There are a number of risk factors that can increase your chances of getting colon polyps. They include:  Being older than 50 years.  Family history of colon polyps or colon cancer.  Long-term colon diseases, such as colitis or Crohn disease.  Being overweight.  Smoking.  Being inactive.  Drinking too much alcohol. SYMPTOMS  Most small polyps do not cause symptoms. If symptoms are present, they may include:  Blood in the stool. The stool may look dark red or black.  Constipation or diarrhea that lasts longer than  1 week. DIAGNOSIS People often do not know they have polyps until their caregiver finds them during a regular checkup. Your caregiver can use 4 tests to check for polyps:  Digital rectal exam. The caregiver wears gloves and feels inside the rectum. This test would find polyps only in the rectum.  Barium enema. The caregiver puts a liquid called barium into your rectum before taking X-rays of your colon. Barium makes your colon look white. Polyps are dark, so they are easy to see in the X-ray pictures.  Sigmoidoscopy. A thin, flexible tube (sigmoidoscope) is placed into your rectum. The sigmoidoscope has a light and tiny camera in it. The caregiver uses the sigmoidoscope to look at the last third of your colon.  Colonoscopy. This test is like sigmoidoscopy, but the caregiver looks at the entire colon. This is the most common method for finding and removing polyps. TREATMENT  Any polyps will be removed during a sigmoidoscopy or colonoscopy. The polyps are then tested for cancer. PREVENTION  To help lower your risk of getting more colon polyps:  Eat plenty of fruits and vegetables. Avoid eating fatty  foods.  Do not smoke.  Avoid drinking alcohol.  Exercise every day.  Lose weight if recommended by your caregiver.  Eat plenty of calcium and folate. Foods that are rich in calcium include milk, cheese, and broccoli. Foods that are rich in folate include chickpeas, kidney beans, and spinach. HOME CARE INSTRUCTIONS Keep all follow-up appointments as directed by your caregiver. You may need periodic exams to check for polyps. SEEK MEDICAL CARE IF: You notice bleeding during a bowel movement. Document Released: 08/16/2004 Document Revised: 02/12/2012 Document Reviewed: 01/30/2012 [facility] Patient Information 2015 [place], [place]. This information is not intended to replace advice given to you by your health care provider. Make sure you discuss any questions you have with your health care  provider.  High-Fiber Diet Fiber is found in fruits, vegetables, and grains. A high-fiber diet encourages the addition of more whole grains, legumes, fruits, and vegetables in your diet. The recommended amount of fiber for adult males is 38 g per day. For adult females, it is 25 g per day. Pregnant and lactating women should get 28 g of fiber per day. If you have a digestive or bowel problem, ask your caregiver for advice before adding high-fiber foods to your diet. Eat a variety of high-fiber foods instead of only a select few type of foods.  PURPOSE  To increase stool bulk.  To make bowel movements more regular to prevent constipation.  To lower cholesterol.  To prevent overeating. WHEN IS THIS DIET USED?  It may be used if you have constipation and hemorrhoids.  It may be used if you have uncomplicated diverticulosis (intestine condition) and irritable bowel syndrome.  It may be used if you need help with weight management.  It may be used if you want to add it to your diet as a protective measure against atherosclerosis, diabetes, and cancer. SOURCES OF FIBER  Whole-grain breads and cereals.  Fruits, such as apples, oranges, bananas, berries, prunes, and pears.  Vegetables, such as green peas, carrots, sweet potatoes, beets, broccoli, cabbage, spinach, and artichokes.  Legumes, such split peas, soy, lentils.  Almonds. FIBER CONTENT IN FOODS Starches and Grains / Dietary Fiber (g)  Cheerios, 1 cup / 3 g  Corn Flakes cereal, 1 cup / 0.7 g  Rice crispy treat cereal, 1 cup / 0.3 g  Instant oatmeal (cooked),  cup / 2 g  Frosted wheat cereal, 1 cup / 5.1 g  Brown, long-grain rice (cooked), 1 cup / 3.5 g  White, long-grain rice (cooked), 1 cup / 0.6 g  Enriched macaroni (cooked), 1 cup / 2.5 g Legumes / Dietary Fiber (g)  Baked beans (canned, plain, or vegetarian),  cup / 5.2 g  Kidney beans (canned),  cup / 6.8 g  Pinto beans (cooked),  cup / 5.5 g Breads  and Crackers / Dietary Fiber (g)  Plain or honey graham crackers, 2 squares / 0.7 g  Saltine crackers, 3 squares / 0.3 g  Plain, salted pretzels, 10 pieces / 1.8 g  Whole-wheat bread, 1 slice / 1.9 g  White bread, 1 slice / 0.7 g  Raisin bread, 1 slice / 1.2 g  Plain bagel, 3 oz / 2 g  Flour tortilla, 1 oz / 0.9 g  Corn tortilla, 1 small / 1.5 g  Hamburger or hotdog bun, 1 small / 0.9 g Fruits / Dietary Fiber (g)  Apple with skin, 1 medium / 4.4 g  Sweetened applesauce,  cup / 1.5 g  Banana,  medium /  1.5 g  Grapes, 10 grapes / 0.4 g  Orange, 1 small / 2.3 g  Raisin, 1.5 oz / 1.6 g  Melon, 1 cup / 1.4 g Vegetables / Dietary Fiber (g)  Green beans (canned),  cup / 1.3 g  Carrots (cooked),  cup / 2.3 g  Broccoli (cooked),  cup / 2.8 g  Peas (cooked),  cup / 4.4 g  Mashed potatoes,  cup / 1.6 g  Lettuce, 1 cup / 0.5 g  Corn (canned),  cup / 1.6 g  Tomato,  cup / 1.1 g Document Released: 11/20/2005 Document Revised: 05/21/2012 Document Reviewed: 02/22/2012 [employer] Patient Information 2015 [place], [place]. This information is not intended to replace advice given to you by your health care provider. Make sure you discuss any questions you have with your health care provider.

## 2015-09-02 NOTE — H&P (Signed)
[name] is an 69 y.o. female.   Chief Complaint: Patient is here for colonoscopy. HPI: Patient is 69 year old Caucasian female who is in for screening colonoscopy. She denies abdominal pain change in bowel habits or rectal bleeding. This is patient's first exam. Family history is negative for CRC.  Past Medical History  Diagnosis Date  . Hyperlipidemia   . Cataract   . Hypothyroidism   . Anxiety   . Depression   . GERD (gastroesophageal reflux disease)     Past Surgical History  Procedure Laterality Date  . Abdominal hysterectomy    . Cataract extraction    . Knee surgery      Family History  Problem Relation Age of Onset  . Cancer Mother     lung  . Stroke Father   . Heart disease Brother    Social History:  reports that she has never smoked. She has never used smokeless tobacco. She reports that she does not drink alcohol or use illicit drugs.  Allergies:  Allergies  Allergen Reactions  . Lipitor [Atorvastatin Calcium] Other (See Comments)    Pain     Medications Prior to Admission  Medication Sig Dispense Refill  . ALPRAZolam (XANAX) 1 MG tablet Take 1 mg by mouth 3 (three) times daily as needed. For anxiety and/or nerves    . aspirin EC 81 MG tablet Take 81 mg by mouth daily.      Marland Kitchen HYDROcodone-acetaminophen (VICODIN) 5-500 MG per tablet Take 1 tablet by mouth every 6 (six) hours as needed. For pain    . levothyroxine (SYNTHROID, LEVOTHROID) 25 MCG tablet Take 25 mcg by mouth daily before breakfast.    . omeprazole (PRILOSEC) 20 MG capsule Take 20 mg by mouth daily as needed. For heartburn relief    . SUPREP BOWEL PREP SOLN Take 1 kit by mouth once. 1 Bottle 0    No results found for this or any previous visit (from the past 48 hour(s)). No results found.  ROS  Blood pressure 138/64, pulse 67, temperature 98.3 F (36.8 C), temperature source Oral, resp. rate 17, height _0  (1.651 m), weight 170 lb (77.111 kg), SpO2 100 %. Physical Exam   Constitutional: She appears well-developed and well-nourished.  HENT:  Mouth/Throat: Oropharynx is clear and moist.  Eyes: Conjunctivae are normal. No scleral icterus.  Neck: No thyromegaly present.  Cardiovascular: Normal rate, regular rhythm and normal heart sounds.   No murmur heard. Respiratory: Effort normal and breath sounds normal.  GI: Soft. She exhibits no distension and no mass. There is no tenderness.  Musculoskeletal: She exhibits no edema.  Lymphadenopathy:    She has no cervical adenopathy.  Neurological: She is alert.  Skin: Skin is warm and dry.     Assessment/Plan Average risk screening colonoscopy.  REHMAN,NAJEEB U 09/02/2015, 9:18 AM

## 2015-09-07 ENCOUNTER — Encounter (HOSPITAL_COMMUNITY): Payer: Self-pay | Admitting: Internal Medicine

## 2015-11-09 DIAGNOSIS — K219 Gastro-esophageal reflux disease without esophagitis: Secondary | ICD-10-CM | POA: Diagnosis not present

## 2015-11-09 DIAGNOSIS — J0100 Acute maxillary sinusitis, unspecified: Secondary | ICD-10-CM | POA: Diagnosis not present

## 2015-11-09 DIAGNOSIS — M1991 Primary osteoarthritis, unspecified site: Secondary | ICD-10-CM | POA: Diagnosis not present

## 2015-11-09 DIAGNOSIS — Z6828 Body mass index (BMI) 28.0-28.9, adult: Secondary | ICD-10-CM | POA: Diagnosis not present

## 2015-11-09 DIAGNOSIS — E063 Autoimmune thyroiditis: Secondary | ICD-10-CM | POA: Diagnosis not present

## 2015-11-09 DIAGNOSIS — G894 Chronic pain syndrome: Secondary | ICD-10-CM | POA: Diagnosis not present

## 2015-11-09 DIAGNOSIS — E663 Overweight: Secondary | ICD-10-CM | POA: Diagnosis not present

## 2015-11-09 DIAGNOSIS — J209 Acute bronchitis, unspecified: Secondary | ICD-10-CM | POA: Diagnosis not present

## 2016-02-02 DIAGNOSIS — Z1389 Encounter for screening for other disorder: Secondary | ICD-10-CM | POA: Diagnosis not present

## 2016-02-02 DIAGNOSIS — Z0000 Encounter for general adult medical examination without abnormal findings: Secondary | ICD-10-CM | POA: Diagnosis not present

## 2016-02-02 DIAGNOSIS — Z6827 Body mass index (BMI) 27.0-27.9, adult: Secondary | ICD-10-CM | POA: Diagnosis not present

## 2016-02-02 DIAGNOSIS — E663 Overweight: Secondary | ICD-10-CM | POA: Diagnosis not present

## 2016-02-21 DIAGNOSIS — E782 Mixed hyperlipidemia: Secondary | ICD-10-CM | POA: Diagnosis not present

## 2016-02-21 DIAGNOSIS — Z1389 Encounter for screening for other disorder: Secondary | ICD-10-CM | POA: Diagnosis not present

## 2016-02-21 DIAGNOSIS — Z0000 Encounter for general adult medical examination without abnormal findings: Secondary | ICD-10-CM | POA: Diagnosis not present

## 2016-02-21 DIAGNOSIS — Z6827 Body mass index (BMI) 27.0-27.9, adult: Secondary | ICD-10-CM | POA: Diagnosis not present

## 2016-04-18 DIAGNOSIS — H8113 Benign paroxysmal vertigo, bilateral: Secondary | ICD-10-CM | POA: Diagnosis not present

## 2016-04-18 DIAGNOSIS — Z1389 Encounter for screening for other disorder: Secondary | ICD-10-CM | POA: Diagnosis not present

## 2016-04-18 DIAGNOSIS — Z6828 Body mass index (BMI) 28.0-28.9, adult: Secondary | ICD-10-CM | POA: Diagnosis not present

## 2016-04-18 DIAGNOSIS — M1991 Primary osteoarthritis, unspecified site: Secondary | ICD-10-CM | POA: Diagnosis not present

## 2016-04-18 DIAGNOSIS — G894 Chronic pain syndrome: Secondary | ICD-10-CM | POA: Diagnosis not present

## 2016-06-01 DIAGNOSIS — E663 Overweight: Secondary | ICD-10-CM | POA: Diagnosis not present

## 2016-06-01 DIAGNOSIS — E782 Mixed hyperlipidemia: Secondary | ICD-10-CM | POA: Diagnosis not present

## 2016-06-01 DIAGNOSIS — E063 Autoimmune thyroiditis: Secondary | ICD-10-CM | POA: Diagnosis not present

## 2016-06-01 DIAGNOSIS — Z1389 Encounter for screening for other disorder: Secondary | ICD-10-CM | POA: Diagnosis not present

## 2016-06-01 DIAGNOSIS — Z6827 Body mass index (BMI) 27.0-27.9, adult: Secondary | ICD-10-CM | POA: Diagnosis not present

## 2016-08-11 ENCOUNTER — Other Ambulatory Visit (HOSPITAL_COMMUNITY): Payer: Self-pay | Admitting: Internal Medicine

## 2016-08-11 DIAGNOSIS — G894 Chronic pain syndrome: Secondary | ICD-10-CM | POA: Diagnosis not present

## 2016-08-11 DIAGNOSIS — E2839 Other primary ovarian failure: Secondary | ICD-10-CM

## 2016-08-11 DIAGNOSIS — F419 Anxiety disorder, unspecified: Secondary | ICD-10-CM | POA: Diagnosis not present

## 2016-08-11 DIAGNOSIS — E063 Autoimmune thyroiditis: Secondary | ICD-10-CM | POA: Diagnosis not present

## 2016-08-11 DIAGNOSIS — Z6827 Body mass index (BMI) 27.0-27.9, adult: Secondary | ICD-10-CM | POA: Diagnosis not present

## 2016-08-15 ENCOUNTER — Ambulatory Visit: Payer: Medicare Other | Admitting: Obstetrics and Gynecology

## 2016-08-17 ENCOUNTER — Other Ambulatory Visit (HOSPITAL_COMMUNITY): Payer: Commercial Managed Care - HMO

## 2016-08-29 ENCOUNTER — Ambulatory Visit: Payer: Medicare Other | Admitting: Obstetrics and Gynecology

## 2016-09-21 ENCOUNTER — Ambulatory Visit (HOSPITAL_COMMUNITY)
Admission: RE | Admit: 2016-09-21 | Discharge: 2016-09-21 | Disposition: A | Discharge status: Home / Self Care | Payer: Commercial Managed Care - HMO | Source: Ambulatory Visit | Attending: Internal Medicine | Admitting: Internal Medicine

## 2016-09-21 DIAGNOSIS — M85852 Other specified disorders of bone density and structure, left thigh: Secondary | ICD-10-CM | POA: Diagnosis not present

## 2016-09-21 DIAGNOSIS — E2839 Other primary ovarian failure: Secondary | ICD-10-CM | POA: Insufficient documentation

## 2016-09-21 DIAGNOSIS — M85832 Other specified disorders of bone density and structure, left forearm: Secondary | ICD-10-CM | POA: Diagnosis not present

## 2016-12-25 DIAGNOSIS — Z1389 Encounter for screening for other disorder: Secondary | ICD-10-CM | POA: Diagnosis not present

## 2016-12-25 DIAGNOSIS — Z23 Encounter for immunization: Secondary | ICD-10-CM | POA: Diagnosis not present

## 2016-12-25 DIAGNOSIS — Z6827 Body mass index (BMI) 27.0-27.9, adult: Secondary | ICD-10-CM | POA: Diagnosis not present

## 2016-12-25 DIAGNOSIS — R42 Dizziness and giddiness: Secondary | ICD-10-CM | POA: Diagnosis not present

## 2016-12-25 DIAGNOSIS — M2550 Pain in unspecified joint: Secondary | ICD-10-CM | POA: Diagnosis not present

## 2016-12-25 DIAGNOSIS — G894 Chronic pain syndrome: Secondary | ICD-10-CM | POA: Diagnosis not present

## 2016-12-25 DIAGNOSIS — H6991 Unspecified Eustachian tube disorder, right ear: Secondary | ICD-10-CM | POA: Diagnosis not present

## 2016-12-25 DIAGNOSIS — F419 Anxiety disorder, unspecified: Secondary | ICD-10-CM | POA: Diagnosis not present

## 2017-03-04 ENCOUNTER — Emergency Department (HOSPITAL_COMMUNITY)
Admission: EM | Admit: 2017-03-04 | Discharge: 2017-03-04 | Disposition: A | Discharge status: Home / Self Care | Payer: Medicare HMO | Attending: Emergency Medicine | Admitting: Emergency Medicine

## 2017-03-04 ENCOUNTER — Encounter (HOSPITAL_COMMUNITY): Payer: Self-pay | Admitting: Cardiology

## 2017-03-04 ENCOUNTER — Emergency Department (HOSPITAL_COMMUNITY): Payer: Medicare HMO

## 2017-03-04 DIAGNOSIS — R42 Dizziness and giddiness: Secondary | ICD-10-CM | POA: Insufficient documentation

## 2017-03-04 DIAGNOSIS — R197 Diarrhea, unspecified: Secondary | ICD-10-CM | POA: Diagnosis not present

## 2017-03-04 DIAGNOSIS — R110 Nausea: Secondary | ICD-10-CM | POA: Diagnosis not present

## 2017-03-04 DIAGNOSIS — H539 Unspecified visual disturbance: Secondary | ICD-10-CM | POA: Diagnosis not present

## 2017-03-04 DIAGNOSIS — E039 Hypothyroidism, unspecified: Secondary | ICD-10-CM | POA: Insufficient documentation

## 2017-03-04 DIAGNOSIS — Z7982 Long term (current) use of aspirin: Secondary | ICD-10-CM | POA: Insufficient documentation

## 2017-03-04 LAB — BASIC METABOLIC PANEL
ANION GAP: 8 (ref 5–15)
BUN: 12 mg/dL (ref 6–20)
CALCIUM: 9.5 mg/dL (ref 8.9–10.3)
CO2: 28 mmol/L (ref 22–32)
Chloride: 103 mmol/L (ref 101–111)
Creatinine, Ser: 0.67 mg/dL (ref 0.44–1.00)
Glucose, Bld: 105 mg/dL — ABNORMAL HIGH (ref 65–99)
Potassium: 4 mmol/L (ref 3.5–5.1)
SODIUM: 139 mmol/L (ref 135–145)

## 2017-03-04 LAB — URINALYSIS, ROUTINE W REFLEX MICROSCOPIC
BACTERIA UA: NONE SEEN
Bilirubin Urine: NEGATIVE
Glucose, UA: NEGATIVE mg/dL
KETONES UR: NEGATIVE mg/dL
LEUKOCYTES UA: NEGATIVE
Nitrite: NEGATIVE
PH: 7 (ref 5.0–8.0)
PROTEIN: NEGATIVE mg/dL
Specific Gravity, Urine: 1.003 — ABNORMAL LOW (ref 1.005–1.030)

## 2017-03-04 LAB — CBC WITH DIFFERENTIAL/PLATELET
BASOS ABS: 0 10*3/uL (ref 0.0–0.1)
BASOS PCT: 0 %
Eosinophils Absolute: 0.1 10*3/uL (ref 0.0–0.7)
Eosinophils Relative: 2 %
HEMATOCRIT: 41.6 % (ref 36.0–46.0)
HEMOGLOBIN: 14.4 g/dL (ref 12.0–15.0)
Lymphocytes Relative: 27 %
Lymphs Abs: 1.6 10*3/uL (ref 0.7–4.0)
MCH: 31.1 pg (ref 26.0–34.0)
MCHC: 34.6 g/dL (ref 30.0–36.0)
MCV: 89.8 fL (ref 78.0–100.0)
Monocytes Absolute: 0.3 10*3/uL (ref 0.1–1.0)
Monocytes Relative: 4 %
NEUTROS ABS: 4.1 10*3/uL (ref 1.7–7.7)
NEUTROS PCT: 67 %
Platelets: 268 10*3/uL (ref 150–400)
RBC: 4.63 MIL/uL (ref 3.87–5.11)
RDW: 12.2 % (ref 11.5–15.5)
WBC: 6.1 10*3/uL (ref 4.0–10.5)

## 2017-03-04 MED ORDER — MECLIZINE HCL 25 MG PO TABS: 25.0000 mg | ORAL_TABLET | Freq: Three times a day (TID) | ORAL | 0 refills | Status: AC | PRN

## 2017-03-04 MED ORDER — MECLIZINE HCL 12.5 MG PO TABS
25.0000 mg | ORAL_TABLET | Freq: Once | ORAL | Status: AC
  Administered 2017-03-04: 25 mg via ORAL
  Filled 2017-03-04: qty 2

## 2017-03-04 NOTE — ED Triage Notes (Signed)
Dizziness since yesterday.

## 2017-03-04 NOTE — ED Notes (Signed)
Pt ambulated to bathroom with stand by nursing assist. Pt reports her dizziness is much better, but not completely gone. Denies pain. PA notified.

## 2017-03-04 NOTE — Discharge Instructions (Signed)
Please change positions slowly. Please do not make any sudden jerking movements with her head. Use Antivert 3 times daily. This medication may cause drowsiness, please use caution getting around when taking this medication. Please increase fluids, and maintain good hydration. Please call your primary physician on Tuesday to set up an appointment for recheck following your emergency department visit. Return to the emergency department if any emergent changes, problems, or concerns.

## 2017-03-04 NOTE — ED Provider Notes (Signed)
[facility] DEPT Provider Note   CSN: [id number] Arrival date & time: 03/04/17  1010  By signing my name below, I, Dolores Hoose, attest that this documentation has been prepared under the direction and in the presence of non-physician practitioner, Lily Kocher, PA-C. Electronically Signed: Dolores Hoose, Scribe. 03/04/2017. 11:04 AM.  History   Chief Complaint Chief Complaint  Patient presents with  . Dizziness    The history is provided by the patient. No language interpreter was used.   HPI Comments:  [name] is a 71 y.o. female with pmhx of HLD who presents to the Emergency Department complaining of intermittent, worsening dizziness onset yesterday. She states that yesterday while at the grocery store she leaned over and felt a sudden dizzy sensation and had to brace herself on her . Pt describes her dizziness as a sensation that the room is spinning exacerbated by leaning over or moving her head. She reports associated visual changes, diarrhea and nausea after her dizzy spells. Pt notes that her symptoms feels similar to when she had an inner ear infection many years ago. She denies any syncope, vomiting, headache,  weakness or numbness. Pt recently began taking OTC biotin supplements.   Past Medical History:  Diagnosis Date  . Anxiety   . Cataract   . Depression   . GERD (gastroesophageal reflux disease)   . Hyperlipidemia   . Hypothyroidism     There are no active problems to display for this patient.   Past Surgical History:  Procedure Laterality Date  . ABDOMINAL HYSTERECTOMY    . CATARACT EXTRACTION    . COLONOSCOPY N/A 09/02/2015   Procedure: COLONOSCOPY;  Surgeon: Rogene Houston, MD;  Location: [facility] SUITE;  Service: Endoscopy;  Laterality: N/A;  930  . KNEE SURGERY      OB History    No data available      Home Medications    Prior to Admission medications   Medication Sig Start Date End Date Taking? Authorizing Provider  ALPRAZolam  Duanne Moron) 1 MG tablet Take 1 mg by mouth 3 (three) times daily as needed. For anxiety and/or nerves    Historical Provider, MD  aspirin EC 81 MG tablet Take 81 mg by mouth daily.      Historical Provider, MD  HYDROcodone-acetaminophen (VICODIN) 5-500 MG per tablet Take 1 tablet by mouth every 6 (six) hours as needed. For pain    Historical Provider, MD  levothyroxine (SYNTHROID, LEVOTHROID) 25 MCG tablet Take 25 mcg by mouth daily before breakfast.    Historical Provider, MD  omeprazole (PRILOSEC) 20 MG capsule Take 20 mg by mouth daily as needed. For heartburn relief    Historical Provider, MD   Family History Family History  Problem Relation Age of Onset  . Cancer Mother     lung  . Stroke Father   . Heart disease Brother     Social History Social History  Substance Use Topics  . Smoking status: Never Smoker  . Smokeless tobacco: Never Used  . Alcohol use No    Allergies   Lipitor [atorvastatin calcium]  Review of Systems Review of Systems  Eyes: Positive for visual disturbance.  Gastrointestinal: Positive for diarrhea and nausea. Negative for vomiting.  Neurological: Positive for dizziness. Negative for syncope, weakness, numbness and headaches.  All other systems reviewed and are negative.  Physical Exam Updated Vital Signs BP (!) 151/70 (BP Location: Left Arm)   Pulse 71   Temp 98.1 F (36.7 C) (Oral)  Resp 14   Ht 5\' 6"  (1.676 m)   Wt 170 lb (77.1 kg)   SpO2 100%   BMI 27.44 kg/m   Physical Exam  Constitutional: She is oriented to person, place, and time. She appears well-developed and well-nourished. No distress.  HENT:  Head: Normocephalic and atraumatic.  Mouth/Throat: Oropharynx is clear and moist.  Eyes: Conjunctivae are normal. Pupils are equal, round, and reactive to light. Right eye exhibits nystagmus. Left eye exhibits nystagmus.  Nystagmus with EOM.   Neck: Normal range of motion. Neck supple. Carotid bruit is not present.  Cardiovascular: Normal  rate, regular rhythm and normal heart sounds.   Pulmonary/Chest: Effort normal and breath sounds normal. She has no wheezes.  Abdominal: She exhibits no distension.  Neurological: She is alert and oriented to person, place, and time.  Grip symmetrical. No gross motor or sensory deficits.   Skin: Skin is warm and dry.  Psychiatric: She has a normal mood and affect.  Nursing note and vitals reviewed.  ED Treatments / Results  DIAGNOSTIC STUDIES:  Oxygen Saturation is 100% on RA, normal by my interpretation.    COORDINATION OF CARE:  11:16 AM Discussed treatment plan with pt at bedside which includes urinalysis, blood work, and imaging. Pt agreed to plan.  Labs (all labs ordered are listed, but only abnormal results are displayed) Labs Reviewed - No data to display  EKG  EKG Interpretation None       Radiology No results found.  Procedures Procedures (including critical care time)  Medications Ordered in ED Medications - No data to display   Initial Impression / Assessment and Plan / ED Course  I have reviewed the triage vital signs and the nursing notes.  Pertinent labs & imaging results that were available during my care of the patient were reviewed by me and considered in my medical decision making (see chart for details).      Final Clinical Impressions(s) / ED Diagnoses  MDM:   Patient has increasing dizziness and vision changes. No history of injury or trauma. No significant medication changes. Will obtain urinalysis and basic labs. Will also obtain CT head scan.  The basic metabolic panel is negative for acute problem. The complete blood count is negative for acute problem. Urinalysis is negative for acute infection on. There is no evidence of dehydration. CT scan of the head is negative for acute event. Patient ambulated in the room and  [place]. Dizziness and sensation of falling is much improved.  Patient will be treated with meclizine. I've asked patient to  change position slowly. I've asked her to maintain good hydration. The patient is to follow-up with her primary physician next week. Patient will return to the emergency department if any emergent changes, problems, or concerns.  Final diagnoses:  Vertigo    New Prescriptions New Prescriptions   No medications on file  **I personally performed the services described in this documentation, which was scribed in my presence. The recorded information has been reviewed and is accurate.Lily Kocher, PA-C 03/04/17 [place], MD 03/05/17 [phone]

## 2017-04-19 DIAGNOSIS — Z6826 Body mass index (BMI) 26.0-26.9, adult: Secondary | ICD-10-CM | POA: Diagnosis not present

## 2017-04-19 DIAGNOSIS — M2550 Pain in unspecified joint: Secondary | ICD-10-CM | POA: Diagnosis not present

## 2017-04-19 DIAGNOSIS — F419 Anxiety disorder, unspecified: Secondary | ICD-10-CM | POA: Diagnosis not present

## 2017-04-19 DIAGNOSIS — M1991 Primary osteoarthritis, unspecified site: Secondary | ICD-10-CM | POA: Diagnosis not present

## 2017-04-19 DIAGNOSIS — G893 Neoplasm related pain (acute) (chronic): Secondary | ICD-10-CM | POA: Diagnosis not present

## 2017-04-19 DIAGNOSIS — H8113 Benign paroxysmal vertigo, bilateral: Secondary | ICD-10-CM | POA: Diagnosis not present

## 2017-04-19 DIAGNOSIS — Z1389 Encounter for screening for other disorder: Secondary | ICD-10-CM | POA: Diagnosis not present

## 2017-06-07 DIAGNOSIS — Z1231 Encounter for screening mammogram for malignant neoplasm of breast: Secondary | ICD-10-CM | POA: Diagnosis not present

## 2017-08-15 DIAGNOSIS — Z6827 Body mass index (BMI) 27.0-27.9, adult: Secondary | ICD-10-CM | POA: Diagnosis not present

## 2017-08-15 DIAGNOSIS — G894 Chronic pain syndrome: Secondary | ICD-10-CM | POA: Diagnosis not present

## 2017-08-15 DIAGNOSIS — Z1389 Encounter for screening for other disorder: Secondary | ICD-10-CM | POA: Diagnosis not present

## 2017-08-15 DIAGNOSIS — E063 Autoimmune thyroiditis: Secondary | ICD-10-CM | POA: Diagnosis not present

## 2017-08-15 DIAGNOSIS — J329 Chronic sinusitis, unspecified: Secondary | ICD-10-CM | POA: Diagnosis not present

## 2017-08-15 DIAGNOSIS — F419 Anxiety disorder, unspecified: Secondary | ICD-10-CM | POA: Diagnosis not present

## 2017-08-15 DIAGNOSIS — K219 Gastro-esophageal reflux disease without esophagitis: Secondary | ICD-10-CM | POA: Diagnosis not present

## 2017-08-15 DIAGNOSIS — F1120 Opioid dependence, uncomplicated: Secondary | ICD-10-CM | POA: Diagnosis not present

## 2017-08-16 DIAGNOSIS — F1120 Opioid dependence, uncomplicated: Secondary | ICD-10-CM | POA: Diagnosis not present

## 2017-08-16 DIAGNOSIS — E748 Other specified disorders of carbohydrate metabolism: Secondary | ICD-10-CM | POA: Diagnosis not present

## 2017-08-16 DIAGNOSIS — Z1389 Encounter for screening for other disorder: Secondary | ICD-10-CM | POA: Diagnosis not present

## 2017-08-16 DIAGNOSIS — Z6827 Body mass index (BMI) 27.0-27.9, adult: Secondary | ICD-10-CM | POA: Diagnosis not present

## 2017-08-16 DIAGNOSIS — E663 Overweight: Secondary | ICD-10-CM | POA: Diagnosis not present

## 2017-09-04 DIAGNOSIS — Z0100 Encounter for examination of eyes and vision without abnormal findings: Secondary | ICD-10-CM | POA: Diagnosis not present

## 2017-09-04 DIAGNOSIS — H2510 Age-related nuclear cataract, unspecified eye: Secondary | ICD-10-CM | POA: Diagnosis not present

## 2017-11-13 DIAGNOSIS — F1120 Opioid dependence, uncomplicated: Secondary | ICD-10-CM | POA: Diagnosis not present

## 2017-11-13 DIAGNOSIS — E6609 Other obesity due to excess calories: Secondary | ICD-10-CM | POA: Diagnosis not present

## 2017-11-13 DIAGNOSIS — Z6826 Body mass index (BMI) 26.0-26.9, adult: Secondary | ICD-10-CM | POA: Diagnosis not present

## 2017-11-13 DIAGNOSIS — F329 Major depressive disorder, single episode, unspecified: Secondary | ICD-10-CM | POA: Diagnosis not present

## 2017-11-13 DIAGNOSIS — F1390 Sedative, hypnotic, or anxiolytic use, unspecified, uncomplicated: Secondary | ICD-10-CM | POA: Diagnosis not present

## 2017-11-13 DIAGNOSIS — G8929 Other chronic pain: Secondary | ICD-10-CM | POA: Diagnosis not present

## 2017-11-13 DIAGNOSIS — Z0001 Encounter for general adult medical examination with abnormal findings: Secondary | ICD-10-CM | POA: Diagnosis not present

## 2017-11-13 DIAGNOSIS — K219 Gastro-esophageal reflux disease without esophagitis: Secondary | ICD-10-CM | POA: Diagnosis not present

## 2017-11-13 DIAGNOSIS — M159 Polyosteoarthritis, unspecified: Secondary | ICD-10-CM | POA: Diagnosis not present

## 2018-02-26 DIAGNOSIS — E663 Overweight: Secondary | ICD-10-CM | POA: Diagnosis not present

## 2018-02-26 DIAGNOSIS — F1120 Opioid dependence, uncomplicated: Secondary | ICD-10-CM | POA: Diagnosis not present

## 2018-02-26 DIAGNOSIS — F419 Anxiety disorder, unspecified: Secondary | ICD-10-CM | POA: Diagnosis not present

## 2018-02-26 DIAGNOSIS — M1991 Primary osteoarthritis, unspecified site: Secondary | ICD-10-CM | POA: Diagnosis not present

## 2018-02-26 DIAGNOSIS — Z1389 Encounter for screening for other disorder: Secondary | ICD-10-CM | POA: Diagnosis not present

## 2018-02-26 DIAGNOSIS — M779 Enthesopathy, unspecified: Secondary | ICD-10-CM | POA: Diagnosis not present

## 2018-02-26 DIAGNOSIS — G894 Chronic pain syndrome: Secondary | ICD-10-CM | POA: Diagnosis not present

## 2018-02-26 DIAGNOSIS — Z6826 Body mass index (BMI) 26.0-26.9, adult: Secondary | ICD-10-CM | POA: Diagnosis not present

## 2018-02-26 DIAGNOSIS — M79605 Pain in left leg: Secondary | ICD-10-CM | POA: Diagnosis not present

## 2018-06-12 DIAGNOSIS — Z13220 Encounter for screening for lipoid disorders: Secondary | ICD-10-CM | POA: Diagnosis not present

## 2018-06-12 DIAGNOSIS — M1991 Primary osteoarthritis, unspecified site: Secondary | ICD-10-CM | POA: Diagnosis not present

## 2018-06-12 DIAGNOSIS — Z6826 Body mass index (BMI) 26.0-26.9, adult: Secondary | ICD-10-CM | POA: Diagnosis not present

## 2018-06-12 DIAGNOSIS — E782 Mixed hyperlipidemia: Secondary | ICD-10-CM | POA: Diagnosis not present

## 2018-06-12 DIAGNOSIS — Z1389 Encounter for screening for other disorder: Secondary | ICD-10-CM | POA: Diagnosis not present

## 2018-06-12 DIAGNOSIS — G894 Chronic pain syndrome: Secondary | ICD-10-CM | POA: Diagnosis not present

## 2018-06-12 DIAGNOSIS — L659 Nonscarring hair loss, unspecified: Secondary | ICD-10-CM | POA: Diagnosis not present

## 2018-06-26 ENCOUNTER — Other Ambulatory Visit (HOSPITAL_COMMUNITY): Payer: Self-pay | Admitting: Internal Medicine

## 2018-06-26 DIAGNOSIS — Z1231 Encounter for screening mammogram for malignant neoplasm of breast: Secondary | ICD-10-CM

## 2018-07-10 ENCOUNTER — Encounter (HOSPITAL_COMMUNITY): Payer: Self-pay

## 2018-07-10 ENCOUNTER — Ambulatory Visit (HOSPITAL_COMMUNITY)
Admission: RE | Admit: 2018-07-10 | Discharge: 2018-07-10 | Disposition: A | Discharge status: Home / Self Care | Payer: Medicare HMO | Source: Ambulatory Visit | Attending: Internal Medicine | Admitting: Internal Medicine

## 2018-07-10 DIAGNOSIS — Z1231 Encounter for screening mammogram for malignant neoplasm of breast: Secondary | ICD-10-CM | POA: Diagnosis not present

## 2018-09-17 DIAGNOSIS — F419 Anxiety disorder, unspecified: Secondary | ICD-10-CM | POA: Diagnosis not present

## 2018-09-17 DIAGNOSIS — M2550 Pain in unspecified joint: Secondary | ICD-10-CM | POA: Diagnosis not present

## 2018-09-17 DIAGNOSIS — Z1389 Encounter for screening for other disorder: Secondary | ICD-10-CM | POA: Diagnosis not present

## 2018-09-17 DIAGNOSIS — Z6827 Body mass index (BMI) 27.0-27.9, adult: Secondary | ICD-10-CM | POA: Diagnosis not present

## 2018-09-17 DIAGNOSIS — E663 Overweight: Secondary | ICD-10-CM | POA: Diagnosis not present

## 2018-09-17 DIAGNOSIS — M1991 Primary osteoarthritis, unspecified site: Secondary | ICD-10-CM | POA: Diagnosis not present

## 2018-09-17 DIAGNOSIS — Z23 Encounter for immunization: Secondary | ICD-10-CM | POA: Diagnosis not present

## 2018-09-17 DIAGNOSIS — G894 Chronic pain syndrome: Secondary | ICD-10-CM | POA: Diagnosis not present

## 2018-10-09 DIAGNOSIS — Z1389 Encounter for screening for other disorder: Secondary | ICD-10-CM | POA: Diagnosis not present

## 2018-10-09 DIAGNOSIS — Z0001 Encounter for general adult medical examination with abnormal findings: Secondary | ICD-10-CM | POA: Diagnosis not present

## 2018-10-09 DIAGNOSIS — E782 Mixed hyperlipidemia: Secondary | ICD-10-CM | POA: Diagnosis not present

## 2018-10-09 DIAGNOSIS — K219 Gastro-esophageal reflux disease without esophagitis: Secondary | ICD-10-CM | POA: Diagnosis not present

## 2018-10-09 DIAGNOSIS — F419 Anxiety disorder, unspecified: Secondary | ICD-10-CM | POA: Diagnosis not present

## 2018-10-09 DIAGNOSIS — E063 Autoimmune thyroiditis: Secondary | ICD-10-CM | POA: Diagnosis not present

## 2018-10-09 DIAGNOSIS — M1991 Primary osteoarthritis, unspecified site: Secondary | ICD-10-CM | POA: Diagnosis not present

## 2018-10-09 DIAGNOSIS — G894 Chronic pain syndrome: Secondary | ICD-10-CM | POA: Diagnosis not present

## 2018-10-09 DIAGNOSIS — Z6827 Body mass index (BMI) 27.0-27.9, adult: Secondary | ICD-10-CM | POA: Diagnosis not present

## 2018-10-15 DIAGNOSIS — E039 Hypothyroidism, unspecified: Secondary | ICD-10-CM | POA: Diagnosis not present

## 2018-10-15 DIAGNOSIS — K219 Gastro-esophageal reflux disease without esophagitis: Secondary | ICD-10-CM | POA: Diagnosis not present

## 2018-10-15 DIAGNOSIS — Z6826 Body mass index (BMI) 26.0-26.9, adult: Secondary | ICD-10-CM | POA: Diagnosis not present

## 2018-10-15 DIAGNOSIS — Z0000 Encounter for general adult medical examination without abnormal findings: Secondary | ICD-10-CM | POA: Diagnosis not present

## 2018-10-15 DIAGNOSIS — T50905A Adverse effect of unspecified drugs, medicaments and biological substances, initial encounter: Secondary | ICD-10-CM | POA: Diagnosis not present

## 2018-10-15 DIAGNOSIS — R7309 Other abnormal glucose: Secondary | ICD-10-CM | POA: Diagnosis not present

## 2018-10-15 DIAGNOSIS — E748 Other specified disorders of carbohydrate metabolism: Secondary | ICD-10-CM | POA: Diagnosis not present

## 2018-10-15 DIAGNOSIS — R202 Paresthesia of skin: Secondary | ICD-10-CM | POA: Diagnosis not present

## 2018-10-15 DIAGNOSIS — T783XXA Angioneurotic edema, initial encounter: Secondary | ICD-10-CM | POA: Diagnosis not present

## 2018-10-15 DIAGNOSIS — I10 Essential (primary) hypertension: Secondary | ICD-10-CM | POA: Diagnosis not present

## 2018-11-02 ENCOUNTER — Encounter (HOSPITAL_COMMUNITY): Payer: Self-pay | Admitting: Emergency Medicine

## 2018-11-02 ENCOUNTER — Emergency Department (HOSPITAL_COMMUNITY)
Admission: EM | Admit: 2018-11-02 | Discharge: 2018-11-02 | Disposition: A | Discharge status: Home / Self Care | Payer: Medicare HMO | Attending: Emergency Medicine | Admitting: Emergency Medicine

## 2018-11-02 ENCOUNTER — Other Ambulatory Visit: Payer: Self-pay

## 2018-11-02 DIAGNOSIS — Z7982 Long term (current) use of aspirin: Secondary | ICD-10-CM | POA: Insufficient documentation

## 2018-11-02 DIAGNOSIS — Z79899 Other long term (current) drug therapy: Secondary | ICD-10-CM | POA: Insufficient documentation

## 2018-11-02 DIAGNOSIS — R21 Rash and other nonspecific skin eruption: Secondary | ICD-10-CM | POA: Diagnosis present

## 2018-11-02 DIAGNOSIS — E039 Hypothyroidism, unspecified: Secondary | ICD-10-CM | POA: Insufficient documentation

## 2018-11-02 DIAGNOSIS — L247 Irritant contact dermatitis due to plants, except food: Secondary | ICD-10-CM | POA: Diagnosis not present

## 2018-11-02 MED ORDER — HYDROCORTISONE 1 % EX CREA: TOPICAL_CREAM | CUTANEOUS | 0 refills | Status: DC

## 2018-11-02 MED ORDER — METHYLPREDNISOLONE SODIUM SUCC 125 MG IJ SOLR
80.0000 mg | Freq: Once | INTRAMUSCULAR | Status: AC
  Administered 2018-11-02: 80 mg via INTRAMUSCULAR
  Filled 2018-11-02: qty 2

## 2018-11-02 NOTE — ED Triage Notes (Signed)
Patient reports being exposed to poison sumac 2 days ago. Rash to face neck, chest, and arms.

## 2018-11-02 NOTE — ED Provider Notes (Signed)
[facility] EMERGENCY DEPARTMENT Provider Note   CSN: [id number] Arrival date & time: 11/02/18  1137     History   Chief Complaint Chief Complaint  Patient presents with  . Rash    HPI [name] is a 72 y.o. female presenting today for rash.  Patient states that she was moving sumac in her backyard 2 days ago and when she woke up the next morning she had an erythematous, itching, splotchy/streaking rash to her arms, upper chest and left side of her neck.  Patient states that she has been using calamine lotion with minimal relief of her symptoms.  Patient states that the rash has now spread to behind her left ear and left cheek as well.  Patient states that aside from the itching she is feeling well today.  Patient denies pain with eye movement, eye redness, difficulty breathing/swallowing, chest pain, shortness of breath, fever or any other concerns at this time.  HPI  Past Medical History:  Diagnosis Date  . Anxiety   . Cataract   . Depression   . GERD (gastroesophageal reflux disease)   . Hyperlipidemia   . Hypothyroidism     There are no active problems to display for this patient.   Past Surgical History:  Procedure Laterality Date  . ABDOMINAL HYSTERECTOMY    . CATARACT EXTRACTION    . COLONOSCOPY N/A 09/02/2015   Procedure: COLONOSCOPY;  Surgeon: Rogene Houston, MD;  [facility];  Service: Endoscopy;  Laterality: N/A;  930  . KNEE SURGERY       OB History    Gravida  3   Para  3   Term  3   Preterm      AB      Living        SAB      TAB      Ectopic      Multiple      Live Births               Home Medications    Prior to Admission medications   Medication Sig Start Date End Date Taking? Authorizing Provider  ALPRAZolam Duanne Moron) 1 MG tablet Take 1 mg by mouth 4 (four) times daily as needed for anxiety or sleep.     [provider]  aspirin 81 MG chewable tablet Chew 81 mg by mouth at bedtime.      [provider]  HYDROcodone-acetaminophen (NORCO) 10-325 MG tablet Take 1 tablet by mouth every 4 (four) hours as needed for moderate pain.    [provider]  hydrocortisone cream 1 % Apply to affected area 2 times daily for one week or less 11/02/18   Nuala Alpha A, PA-C  meclizine (ANTIVERT) 25 MG tablet Take 1 tablet (25 mg total) by mouth 3 (three) times daily as needed for dizziness. 03/04/17   Lily Kocher, PA-C  omeprazole (PRILOSEC) 20 MG capsule Take 20 mg by mouth at bedtime as needed (for acid reflux).     [provider]  Specialty Vitamins Products (BIOTIN PLUS KERATIN) 10000-100 MCG-MG TABS Take 1 tablet by mouth daily.    [provider]    Family History Family History  Problem Relation Age of Onset  . Cancer Mother        lung  . Stroke Father   . Heart disease Brother     Social History Social History   Tobacco Use  . Smoking status: Never Smoker  .  Smokeless tobacco: Never Used  Substance Use Topics  . Alcohol use: No  . Drug use: No     Allergies   Lipitor [atorvastatin calcium]   Review of Systems Review of Systems  Constitutional: Negative.  Negative for chills and fever.  HENT: Negative.  Negative for congestion, drooling, facial swelling, rhinorrhea, sore throat and voice change.   Eyes: Negative.  Negative for pain and visual disturbance.  Respiratory: Negative.  Negative for cough, choking and shortness of breath.   Cardiovascular: Negative.  Negative for chest pain.  Gastrointestinal: Negative.  Negative for abdominal pain, nausea and vomiting.  Musculoskeletal: Negative.  Negative for arthralgias and myalgias.  Skin: Positive for rash. Negative for wound.  Neurological: Negative.  Negative for dizziness, weakness and headaches.   Physical Exam Updated Vital Signs BP (!) 163/73 (BP Location: Right Arm) Comment: Simultaneous filing. User may not have seen previous data. Comment (BP Location):  Simultaneous filing. User may not have seen previous data.  Pulse 88 Comment: Simultaneous filing. User may not have seen previous data.  Temp 98.5 F (36.9 C) (Oral) Comment: Simultaneous filing. User may not have seen previous data. Comment (Src): Simultaneous filing. User may not have seen previous data.  Resp 18 Comment: Simultaneous filing. User may not have seen previous data.  Ht 5\' 6"  (1.676 m)   Wt 74.4 kg   SpO2 98% Comment: Simultaneous filing. User may not have seen previous data.  BMI 26.47 kg/m   Physical Exam  Constitutional: She appears well-developed and well-nourished. No distress.  HENT:  Head: Normocephalic and atraumatic.  Right Ear: External ear normal.  Left Ear: External ear normal.  Nose: Nose normal.  Mouth/Throat: Uvula is midline, oropharynx is clear and moist and mucous membranes are normal.  Eyes: Pupils are equal, round, and reactive to light. Conjunctivae and EOM are normal.  No pain with extraocular movement  Neck: Trachea normal, normal range of motion, full passive range of motion without pain and phonation normal. Neck supple. No tracheal deviation present.  Cardiovascular: Normal rate, regular rhythm and normal heart sounds.  Pulmonary/Chest: Effort normal and breath sounds normal. No respiratory distress.  Abdominal: Soft. There is no tenderness. There is no rebound and no guarding.  Musculoskeletal: Normal range of motion.  Neurological: She is alert. GCS eye subscore is 4. GCS verbal subscore is 5. GCS motor subscore is 6.  Speech is clear and goal oriented, follows commands Major Cranial nerves without deficit, no facial droop Sensation normal to light touch Moves extremities without ataxia, coordination intact Normal gait  Skin: Skin is warm, dry and intact. Capillary refill takes less than 2 seconds. Rash noted.     Diffuse, erythematous rash present in a streaking pattern to upper extremities, chest and left neck/face.  No airway, ocular  or otic involvement.  No induration or fluctuance present.  No drainage present.  Psychiatric: She has a normal mood and affect. Her behavior is normal.   ED Treatments / Results  Labs (all labs ordered are listed, but only abnormal results are displayed) Labs Reviewed - No data to display  EKG None  Radiology No results found.  Procedures Procedures (including critical care time)  Medications Ordered in ED Medications  methylPREDNISolone sodium succinate (SOLU-MEDROL) 125 mg/2 mL injection 80 mg (80 mg Intramuscular Given 11/02/18 1254)     Initial Impression / Assessment and Plan / ED Course  I have reviewed the triage vital signs and the nursing notes.  Pertinent labs & imaging results  that were available during my care of the patient were reviewed by me and considered in my medical decision making (see chart for details).     72 year old female presenting today with pruritic rash following sumac exposure two days ago.  Patient has been given Solu-Medrol injection today for relief of symptoms, denies history of diabetes. Rash consistent with contact dermatitis. Patient denies any difficulty breathing or swallowing.  Pt has a patent airway without stridor and is handling secretions without difficulty; no angioedema. No blisters, no pustules, no warmth, no draining sinus tracts, no superficial abscesses, no bullous impetigo, no vesicles, no desquamation, no target lesions with dusky purpura or a central bulla. Not tender to touch. No concern for superimposed infection. No concern for SJS, TEN, TSS, tick borne illness, syphilis or other life-threatening condition. Hydrocortisone prescribed for relief of symptoms.  Patient informed of potential for thinning of skin with steroid use to the face, low potency steroid has been prescribed and patient informed to follow-up with PCP on Monday for further evaluation.  Patient informed that she may stop using the steroid medication sooner if  symptoms resolve. Recommend Benadryl as needed for pruritis.  At discharge patient is afebrile, not tachycardic, not hypotensive, well-appearing and in no acute distress.  At this time there does not appear to be any evidence of an acute emergency medical condition and the patient appears stable for discharge with appropriate outpatient follow up. Diagnosis was discussed with patient who verbalizes understanding of care plan and is agreeable to discharge. I have discussed return precautions with patient who verbalizes understanding of return precautions. Patient strongly encouraged to follow-up with their PCP on Monday. All questions answered.  Patient's case discussed with Dr. Roderic Palau who agrees with plan, solumedrol and discharge hydrocortisone with follow-up.   Note: Portions of this report may have been transcribed using voice recognition software. Every effort was made to ensure accuracy; however, inadvertent computerized transcription errors may still be present. Final Clinical Impressions(s) / ED Diagnoses   Final diagnoses:  Irritant contact dermatitis due to plants, except food    ED Discharge Orders         Ordered    hydrocortisone cream 1 %     11/02/18 1325           Gari Crown 11/02/18 1331    Milton Ferguson, MD 11/03/18 [phone]

## 2018-11-02 NOTE — Discharge Instructions (Signed)
You have been diagnosed today with irritant contact dermatitis due to plant. At this time there does not appear to be the presence of an emergent medical condition, however there is always the potential for conditions to change. Please read and follow the below instructions.  Please return to the Emergency Department immediately for any new or worsening symptoms. Please be sure to follow up with your Primary Care Provider on Monday regarding your visit today; please call their office to schedule an appointment even if you are feeling better for a follow-up visit. You may use the steroid medication hydrocortisone as directed.  Apply twice daily for 1 week, you may stop using this sooner if symptoms resolve.  Please see your primary care provider on Monday for further evaluation. May also use over-the-counter Benadryl as directed on the packaging to help with itching.  Contact a health care provider if: Your condition does not improve with treatment. Your condition gets worse. You have signs of infection such as swelling, tenderness, redness, soreness, or warmth in the affected area. You have a fever. You have new symptoms. Get help right away if: You have a severe headache, neck pain, or neck stiffness. You vomit. You feel very sleepy. You notice red streaks coming from the affected area. Your bone or joint underneath the affected area becomes painful after the skin has healed. The affected area turns darker. You have difficulty breathing.  Please read the additional information packets attached to your discharge summary.  Do not take your medicine if  develop an itchy rash, swelling in your mouth or lips, or difficulty breathing.

## 2018-11-02 NOTE — ED Notes (Signed)
Pt has diffuse rash on face, chest, and right arm. Complains of itching. Has tried calamine lotion

## 2018-11-06 DIAGNOSIS — Z6826 Body mass index (BMI) 26.0-26.9, adult: Secondary | ICD-10-CM | POA: Diagnosis not present

## 2018-11-06 DIAGNOSIS — E663 Overweight: Secondary | ICD-10-CM | POA: Diagnosis not present

## 2018-11-06 DIAGNOSIS — L239 Allergic contact dermatitis, unspecified cause: Secondary | ICD-10-CM | POA: Diagnosis not present

## 2018-12-11 DIAGNOSIS — L648 Other androgenic alopecia: Secondary | ICD-10-CM | POA: Diagnosis not present

## 2018-12-11 DIAGNOSIS — L821 Other seborrheic keratosis: Secondary | ICD-10-CM | POA: Diagnosis not present

## 2018-12-11 DIAGNOSIS — D225 Melanocytic nevi of trunk: Secondary | ICD-10-CM | POA: Diagnosis not present

## 2018-12-11 DIAGNOSIS — D485 Neoplasm of uncertain behavior of skin: Secondary | ICD-10-CM | POA: Diagnosis not present

## 2018-12-12 DIAGNOSIS — E782 Mixed hyperlipidemia: Secondary | ICD-10-CM | POA: Diagnosis not present

## 2018-12-12 DIAGNOSIS — M1991 Primary osteoarthritis, unspecified site: Secondary | ICD-10-CM | POA: Diagnosis not present

## 2018-12-12 DIAGNOSIS — G894 Chronic pain syndrome: Secondary | ICD-10-CM | POA: Diagnosis not present

## 2018-12-12 DIAGNOSIS — E063 Autoimmune thyroiditis: Secondary | ICD-10-CM | POA: Diagnosis not present

## 2018-12-12 DIAGNOSIS — Z6826 Body mass index (BMI) 26.0-26.9, adult: Secondary | ICD-10-CM | POA: Diagnosis not present

## 2018-12-12 DIAGNOSIS — E663 Overweight: Secondary | ICD-10-CM | POA: Diagnosis not present

## 2018-12-12 DIAGNOSIS — T50905A Adverse effect of unspecified drugs, medicaments and biological substances, initial encounter: Secondary | ICD-10-CM | POA: Diagnosis not present

## 2018-12-12 DIAGNOSIS — F419 Anxiety disorder, unspecified: Secondary | ICD-10-CM | POA: Diagnosis not present

## 2019-01-24 DIAGNOSIS — Z6827 Body mass index (BMI) 27.0-27.9, adult: Secondary | ICD-10-CM | POA: Diagnosis not present

## 2019-01-24 DIAGNOSIS — M1991 Primary osteoarthritis, unspecified site: Secondary | ICD-10-CM | POA: Diagnosis not present

## 2019-01-24 DIAGNOSIS — E063 Autoimmune thyroiditis: Secondary | ICD-10-CM | POA: Diagnosis not present

## 2019-01-24 DIAGNOSIS — F419 Anxiety disorder, unspecified: Secondary | ICD-10-CM | POA: Diagnosis not present

## 2019-01-24 DIAGNOSIS — E663 Overweight: Secondary | ICD-10-CM | POA: Diagnosis not present

## 2019-01-24 DIAGNOSIS — Z1389 Encounter for screening for other disorder: Secondary | ICD-10-CM | POA: Diagnosis not present

## 2019-02-20 DIAGNOSIS — F1120 Opioid dependence, uncomplicated: Secondary | ICD-10-CM | POA: Diagnosis not present

## 2019-02-20 DIAGNOSIS — Z6828 Body mass index (BMI) 28.0-28.9, adult: Secondary | ICD-10-CM | POA: Diagnosis not present

## 2019-02-20 DIAGNOSIS — F419 Anxiety disorder, unspecified: Secondary | ICD-10-CM | POA: Diagnosis not present

## 2019-02-20 DIAGNOSIS — G894 Chronic pain syndrome: Secondary | ICD-10-CM | POA: Diagnosis not present

## 2019-07-18 DIAGNOSIS — M170 Bilateral primary osteoarthritis of knee: Secondary | ICD-10-CM | POA: Diagnosis not present

## 2019-07-18 DIAGNOSIS — J309 Allergic rhinitis, unspecified: Secondary | ICD-10-CM | POA: Diagnosis not present

## 2019-07-18 DIAGNOSIS — Z7982 Long term (current) use of aspirin: Secondary | ICD-10-CM | POA: Diagnosis not present

## 2019-07-18 DIAGNOSIS — K219 Gastro-esophageal reflux disease without esophagitis: Secondary | ICD-10-CM | POA: Diagnosis not present

## 2019-07-18 DIAGNOSIS — E785 Hyperlipidemia, unspecified: Secondary | ICD-10-CM | POA: Diagnosis not present

## 2019-07-18 DIAGNOSIS — F419 Anxiety disorder, unspecified: Secondary | ICD-10-CM | POA: Diagnosis not present

## 2019-07-18 DIAGNOSIS — Z6828 Body mass index (BMI) 28.0-28.9, adult: Secondary | ICD-10-CM | POA: Diagnosis not present

## 2019-07-22 DIAGNOSIS — E663 Overweight: Secondary | ICD-10-CM | POA: Diagnosis not present

## 2019-07-22 DIAGNOSIS — Z6828 Body mass index (BMI) 28.0-28.9, adult: Secondary | ICD-10-CM | POA: Diagnosis not present

## 2019-07-22 DIAGNOSIS — F419 Anxiety disorder, unspecified: Secondary | ICD-10-CM | POA: Diagnosis not present

## 2019-08-04 DIAGNOSIS — E782 Mixed hyperlipidemia: Secondary | ICD-10-CM | POA: Diagnosis not present

## 2019-08-04 DIAGNOSIS — E063 Autoimmune thyroiditis: Secondary | ICD-10-CM | POA: Diagnosis not present

## 2019-08-04 DIAGNOSIS — G894 Chronic pain syndrome: Secondary | ICD-10-CM | POA: Diagnosis not present

## 2019-08-04 DIAGNOSIS — M159 Polyosteoarthritis, unspecified: Secondary | ICD-10-CM | POA: Diagnosis not present

## 2019-09-03 DIAGNOSIS — E039 Hypothyroidism, unspecified: Secondary | ICD-10-CM | POA: Diagnosis not present

## 2019-09-03 DIAGNOSIS — D509 Iron deficiency anemia, unspecified: Secondary | ICD-10-CM | POA: Diagnosis not present

## 2019-10-02 DIAGNOSIS — E7849 Other hyperlipidemia: Secondary | ICD-10-CM | POA: Diagnosis not present

## 2019-10-02 DIAGNOSIS — K219 Gastro-esophageal reflux disease without esophagitis: Secondary | ICD-10-CM | POA: Diagnosis not present

## 2019-10-02 DIAGNOSIS — Z6827 Body mass index (BMI) 27.0-27.9, adult: Secondary | ICD-10-CM | POA: Diagnosis not present

## 2019-10-02 DIAGNOSIS — F419 Anxiety disorder, unspecified: Secondary | ICD-10-CM | POA: Diagnosis not present

## 2019-10-02 DIAGNOSIS — G894 Chronic pain syndrome: Secondary | ICD-10-CM | POA: Diagnosis not present

## 2019-10-02 DIAGNOSIS — Z1389 Encounter for screening for other disorder: Secondary | ICD-10-CM | POA: Diagnosis not present

## 2019-10-02 DIAGNOSIS — Z23 Encounter for immunization: Secondary | ICD-10-CM | POA: Diagnosis not present

## 2019-10-02 DIAGNOSIS — Z0000 Encounter for general adult medical examination without abnormal findings: Secondary | ICD-10-CM | POA: Diagnosis not present

## 2019-12-23 DIAGNOSIS — Z0100 Encounter for examination of eyes and vision without abnormal findings: Secondary | ICD-10-CM | POA: Diagnosis not present

## 2019-12-23 DIAGNOSIS — H5210 Myopia, unspecified eye: Secondary | ICD-10-CM | POA: Diagnosis not present

## 2020-02-10 DIAGNOSIS — M1991 Primary osteoarthritis, unspecified site: Secondary | ICD-10-CM | POA: Diagnosis not present

## 2020-02-10 DIAGNOSIS — Z6827 Body mass index (BMI) 27.0-27.9, adult: Secondary | ICD-10-CM | POA: Diagnosis not present

## 2020-02-10 DIAGNOSIS — K219 Gastro-esophageal reflux disease without esophagitis: Secondary | ICD-10-CM | POA: Diagnosis not present

## 2020-02-10 DIAGNOSIS — G894 Chronic pain syndrome: Secondary | ICD-10-CM | POA: Diagnosis not present

## 2020-02-10 DIAGNOSIS — F419 Anxiety disorder, unspecified: Secondary | ICD-10-CM | POA: Diagnosis not present

## 2020-03-03 DIAGNOSIS — F4542 Pain disorder with related psychological factors: Secondary | ICD-10-CM | POA: Diagnosis not present

## 2020-03-03 DIAGNOSIS — M159 Polyosteoarthritis, unspecified: Secondary | ICD-10-CM | POA: Diagnosis not present

## 2020-03-03 DIAGNOSIS — G894 Chronic pain syndrome: Secondary | ICD-10-CM | POA: Diagnosis not present

## 2020-03-03 DIAGNOSIS — E7849 Other hyperlipidemia: Secondary | ICD-10-CM | POA: Diagnosis not present

## 2020-03-04 ENCOUNTER — Encounter: Payer: Medicare HMO | Admitting: Obstetrics and Gynecology

## 2020-03-09 ENCOUNTER — Ambulatory Visit: Payer: Medicare HMO

## 2020-03-09 ENCOUNTER — Telehealth: Payer: Self-pay | Admitting: Obstetrics and Gynecology

## 2020-03-09 ENCOUNTER — Encounter: Payer: Self-pay | Admitting: Orthopaedic Surgery

## 2020-03-09 ENCOUNTER — Ambulatory Visit: Payer: Medicare HMO | Admitting: Orthopaedic Surgery

## 2020-03-09 ENCOUNTER — Other Ambulatory Visit: Payer: Self-pay

## 2020-03-09 VITALS — BP 152/83 | HR 81 | Ht 66.0 in | Wt 165.0 lb

## 2020-03-09 DIAGNOSIS — M25561 Pain in right knee: Secondary | ICD-10-CM

## 2020-03-09 DIAGNOSIS — G8929 Other chronic pain: Secondary | ICD-10-CM

## 2020-03-09 DIAGNOSIS — M25562 Pain in left knee: Secondary | ICD-10-CM

## 2020-03-09 NOTE — Addendum Note (Signed)
Addended by: Derek Mound A on: 03/09/2020 03:52 PM   Modules accepted: Orders

## 2020-03-09 NOTE — Telephone Encounter (Signed)

## 2020-03-09 NOTE — Progress Notes (Signed)
Subjective:    Patient ID: [name], female    DOB: 04-11-1946, 74 y.o.   MRN: [id number]  HPI She has bilateral knee pain, more on the left than the right, which has been getting worse over the last several months. She has popping and swelling but no giving way. She has more pain laterally.  She has no trauma, no redness, no numbness.  She has been seen at [facility] and I have reviewed the notes.  She is taking Mobic and using Voltaren Gel.   Review of Systems  Constitutional: Positive for activity change.  Musculoskeletal: Positive for arthralgias, gait problem and joint swelling.  All other systems reviewed and are negative.  For Review of Systems, all other systems reviewed and are negative.  The following is a summary of the past history medically, past history surgically, known current medicines, social history and family history.  This information is gathered electronically by the computer from prior information and documentation.  I review this each visit and have found including this information at this point in the chart is beneficial and informative.   Past Medical History:  Diagnosis Date  . Anxiety   . Cataract   . Depression   . GERD (gastroesophageal reflux disease)   . Hyperlipidemia   . Hypothyroidism     Past Surgical History:  Procedure Laterality Date  . ABDOMINAL HYSTERECTOMY    . CATARACT EXTRACTION    . COLONOSCOPY N/A 09/02/2015   Procedure: COLONOSCOPY;  Surgeon: Rogene Houston, MD;  Location[facility];  Service: Endoscopy;  Laterality: N/A;  930  . KNEE SURGERY      Current Outpatient Medications on File Prior to Visit  Medication Sig Dispense Refill  . ALPRAZolam (XANAX) 1 MG tablet Take 1 mg by mouth 4 (four) times daily as needed for anxiety or sleep.     Marland Kitchen aspirin 81 MG chewable tablet Chew 81 mg by mouth at bedtime.     . meclizine (ANTIVERT) 25 MG tablet Take 1 tablet (25 mg total) by mouth 3 (three) times daily as needed  for dizziness. 30 tablet 0  . meloxicam (MOBIC) 7.5 MG tablet     . omeprazole (PRILOSEC) 20 MG capsule Take 20 mg by mouth at bedtime as needed (for acid reflux).     . pravastatin (PRAVACHOL) 40 MG tablet Take 40 mg by mouth daily.    Marland Kitchen Specialty Vitamins Products (BIOTIN PLUS KERATIN) 10000-100 MCG-MG TABS Take 1 tablet by mouth daily.    Marland Kitchen HYDROcodone-acetaminophen (NORCO) 10-325 MG tablet Take 1 tablet by mouth every 4 (four) hours as needed for moderate pain.     No current facility-administered medications on file prior to visit.    Social History   Socioeconomic History  . Marital status: Married    Spouse name: Not on file  . Number of children: Not on file  . Years of education: Not on file  . Highest education level: Not on file  Occupational History  . Not on file  Tobacco Use  . Smoking status: Never Smoker  . Smokeless tobacco: Never Used  Substance and Sexual Activity  . Alcohol use: No  . Drug use: No  . Sexual activity: Never  Other Topics Concern  . Not on file  Social History Narrative  . Not on file   Social Determinants of Health   Financial Resource Strain:   . Difficulty of Paying Living Expenses:   Food Insecurity:   .  Worried About Charity fundraiser in the Last Year:   . Arboriculturist in the Last Year:   Transportation Needs:   . Film/video editor (Medical):   Marland Kitchen Lack of Transportation (Non-Medical):   Physical Activity:   . Days of Exercise per Week:   . Minutes of Exercise per Session:   Stress:   . Feeling of Stress :   Social Connections:   . Frequency of Communication with Friends and Family:   . Frequency of Social Gatherings with Friends and Family:   . Attends Religious Services:   . Active Member of Clubs or Organizations:   . Attends Archivist Meetings:   Marland Kitchen Marital Status:   Intimate Partner Violence:   . Fear of Current or Ex-Partner:   . Emotionally Abused:   Marland Kitchen Physically Abused:   . Sexually Abused:      Family History  Problem Relation Age of Onset  . Cancer Mother        lung  . Stroke Father   . Heart disease Brother     BP (!) 152/83   Pulse 81   Ht 5\' 6"  (1.676 m)   Wt 165 lb (74.8 kg)   BMI 26.63 kg/m   Body mass index is 26.63 kg/m.     Objective:   Physical Exam Vitals and nursing note reviewed.  Constitutional:      Appearance: She is well-developed.  HENT:     Head: Normocephalic and atraumatic.  Eyes:     Conjunctiva/sclera: Conjunctivae normal.     Pupils: Pupils are equal, round, and reactive to light.  Cardiovascular:     Rate and Rhythm: Normal rate and regular rhythm.  Pulmonary:     Effort: Pulmonary effort is normal.  Abdominal:     Palpations: Abdomen is soft.  Musculoskeletal:     Cervical back: Normal range of motion and neck supple.       Legs:  Skin:    General: Skin is warm and dry.  Neurological:     Mental Status: She is alert and oriented to person, place, and time.     Cranial Nerves: No cranial nerve deficit.     Motor: No abnormal muscle tone.     Coordination: Coordination normal.     Deep Tendon Reflexes: Reflexes are normal and symmetric. Reflexes normal.  Psychiatric:        Behavior: Behavior normal.        Thought Content: Thought content normal.        Judgment: Judgment normal.    X-rays were done of both knees, reported separately.       Assessment & Plan:   Encounter Diagnoses  Name Primary?  . Chronic pain of right knee Yes  . Chronic pain of left knee    PROCEDURE NOTE:  The patient requests injections of the left knee , verbal consent was obtained.  The left knee was prepped appropriately after time out was performed.   Sterile technique was observed and injection of 1 cc of Depo-Medrol 40 mg with several cc's of plain xylocaine. Anesthesia was provided by ethyl chloride and a 20-gauge needle was used to inject the knee area. The injection was tolerated well.  A band aid dressing was  applied.  The patient was advised to apply ice later today and tomorrow to the injection sight as needed.  PROCEDURE NOTE:  The patient requests injections of the right knee , verbal consent was obtained.  The right knee was prepped appropriately after time out was performed.   Sterile technique was observed and injection of 1 cc of Depo-Medrol 40 mg with several cc's of plain xylocaine. Anesthesia was provided by ethyl chloride and a 20-gauge needle was used to inject the knee area. The injection was tolerated well.  A band aid dressing was applied.  The patient was advised to apply ice later today and tomorrow to the injection sight as needed.  Continue the Mobic.  Return in three weeks.  Call if any problem.  Precautions discussed.   Electronically Signed Sanjuana Kava, MD 4/6/20212:41 PM

## 2020-03-10 ENCOUNTER — Encounter: Payer: Self-pay | Admitting: Obstetrics and Gynecology

## 2020-03-10 ENCOUNTER — Ambulatory Visit: Payer: Medicare HMO | Admitting: Obstetrics and Gynecology

## 2020-03-10 ENCOUNTER — Telehealth: Payer: Self-pay | Admitting: Obstetrics and Gynecology

## 2020-03-10 ENCOUNTER — Other Ambulatory Visit: Payer: Self-pay

## 2020-03-10 VITALS — BP 137/76 | HR 76 | Ht 66.0 in | Wt 166.0 lb

## 2020-03-10 DIAGNOSIS — N8110 Cystocele, unspecified: Secondary | ICD-10-CM | POA: Diagnosis not present

## 2020-03-10 DIAGNOSIS — R300 Dysuria: Secondary | ICD-10-CM

## 2020-03-10 DIAGNOSIS — R52 Pain, unspecified: Secondary | ICD-10-CM

## 2020-03-10 DIAGNOSIS — N816 Rectocele: Secondary | ICD-10-CM

## 2020-03-10 LAB — POCT URINALYSIS DIPSTICK OB
Glucose, UA: NEGATIVE
Ketones, UA: NEGATIVE
Leukocytes, UA: NEGATIVE
Nitrite, UA: NEGATIVE
POC,PROTEIN,UA: NEGATIVE

## 2020-03-10 MED ORDER — ESTRADIOL 0.1 MG/GM VA CREA: 0.2500 | TOPICAL_CREAM | VAGINAL | 3 refills | Status: DC

## 2020-03-10 NOTE — Telephone Encounter (Signed)
Pt just left from appt with Ferg she said the cream he gave her is over 100 dollars and she needs him to cancel that order and call something else in please.

## 2020-03-10 NOTE — Progress Notes (Signed)
Patient ID: ZAHRA BISSELL, female   DOB: 1946-03-24, 74 y.o.   MRN: [id number]    [facility] Visit  03/10/20        Patient name: BRIAUNA DARGENIO MRN [id number]  Date of birth: 01-31-1946  CC & HPI:  DEBORIA APLEY is a 74 y.o. female presenting today for pain while lifting and she is worried that she may have strained her bladder or have a bladder infection.  She has some discomfort in the left lower quadrant.  On bimanual exam this is repeated and recurs with digital elevation of the left side of the vaginal cuff, which also involves touching the bladder  ROS:  Review of Systems  Constitutional: Negative for diaphoresis, fever, malaise/fatigue and weight loss.  HENT: Negative for congestion and sore throat.   Eyes: Negative for blurred vision and double vision.  Respiratory: Negative for cough and shortness of breath.   Cardiovascular: Negative for chest pain, palpitations and leg swelling.  Gastrointestinal: Negative for constipation, diarrhea, nausea and vomiting.  Genitourinary: Positive for flank pain (left). Negative for frequency and urgency.  Musculoskeletal: Negative for back pain, falls and myalgias.  Skin: Negative for rash.  Neurological: Negative for dizziness, weakness and headaches.  Psychiatric/Behavioral: Negative for depression. The patient is not nervous/anxious.    Component     Latest Ref Rng & Units 03/10/2020  Glucose, UA     Negative Negative  Ketones, UA      neg  RBC, UA      small  Protein     Negative, Trace, Small (1+), Moderate (2+), Large (3+), 4+ Negative  Nitrite, UA      neg  Leukocytes,UA     Negative Negative     Pertinent History Reviewed:   Reviewed: Significant for hysterectomy Medical         Past Medical History:  Diagnosis Date  . Anxiety   . Cataract   . Depression   . GERD (gastroesophageal reflux disease)   . Hyperlipidemia   . Hypothyroidism                               Surgical Hx:    Past Surgical  History:  Procedure Laterality Date  . ABDOMINAL HYSTERECTOMY    . CATARACT EXTRACTION    . COLONOSCOPY N/A 09/02/2015   Procedure: COLONOSCOPY;  Surgeon: Rogene Houston, MD;  [facility];  Service: Endoscopy;  Laterality: N/A;  930  . KNEE SURGERY     Medications: Reviewed & Updated - see associated section                       Current Outpatient Medications:  .  ALPRAZolam (XANAX) 1 MG tablet, Take 1 mg by mouth 4 (four) times daily as needed for anxiety or sleep. , Disp: , Rfl:  .  aspirin 81 MG chewable tablet, Chew 81 mg by mouth at bedtime. , Disp: , Rfl:  .  loratadine (CLARITIN) 10 MG tablet, Take 10 mg by mouth daily., Disp: , Rfl:  .  meclizine (ANTIVERT) 25 MG tablet, Take 1 tablet (25 mg total) by mouth 3 (three) times daily as needed for dizziness., Disp: 30 tablet, Rfl: 0 .  meloxicam (MOBIC) 7.5 MG tablet, , Disp: , Rfl:  .  omeprazole (PRILOSEC) 20 MG capsule, Take 20 mg by mouth at bedtime as needed (for acid reflux). , Disp: ,  Rfl:  .  pravastatin (PRAVACHOL) 40 MG tablet, Take 40 mg by mouth daily., Disp: , Rfl:  .  Specialty Vitamins Products (BIOTIN PLUS KERATIN) 10000-100 MCG-MG TABS, Take 1 tablet by mouth daily., Disp: , Rfl:    Social History: Reviewed -  reports that she has never smoked. She has never used smokeless tobacco.  Objective Findings:  Vitals: Blood pressure 137/76, pulse 76, height 5\' 6"  (1.676 m), weight 166 lb (75.3 kg).  PHYSICAL EXAMINATION General appearance - alert, well appearing, and in no distress, oriented to person, place, and time, overweight and well hydrated Mental status - alert, oriented to person, place, and time, normal mood, behavior, speech, dress, motor activity, and thought processes, affect appropriate to mood Chest - not examined Heart - not examined Abdomen - not examined Breasts - not examined Skin - normal coloration and turgor, no rashes, no suspicious skin lesions noted  PELVIC External genitalia  -relaxed introitus Vulva -postmenopausal atrophy Vagina -adequate support of vaginal apex, small amount of rotation and descent with Valsalva of the bladder Cervix - surgically removed Uterus -surgically absent Adnexa -no masses [place] -not applicable Rectal - normal rectal, no masses, rectocele noted to greater than 90 degrees on digital rectal exam, patient explained findings and instructed in splinting and Kegel exercises    Assessment & Plan:   A:  1. Small hematuria rule out UTI, urine culture pending 2. Atrophic vaginal tissues with grade 1 cystocele, rectocele 3. Dysuria  P:  1. Rx vaginal cream, Estrace vaginal cream at bedtime 3 times per week 2. Kegel exercises to help with bladder control and perineal muscle support 3.  Follow-up as needed   By signing my name below, I, [employer], attest that this documentation has been prepared under the direction and in the presence of Jonnie Kind, MD. Electronically Signed: [place]. 03/10/20. 12:12 PM.  I personally performed the services described in this documentation, which was SCRIBED in my presence. The recorded information has been reviewed and considered accurate. It has been edited as necessary during review. Jonnie Kind, MD

## 2020-03-11 NOTE — Telephone Encounter (Signed)
Please tell her to download GoodRx. The App shows that at [facility] in [place], the prescription is $69.42.  At [facility], in [place], it's 49.50. There is no cheap vaginal cream.  Premarin vaginal cream is even costlier.

## 2020-03-11 NOTE — Telephone Encounter (Signed)
Telephoned patient at home number and advised could use the GoodRx to prescription for a cheaper cost. Patient voiced understanding.

## 2020-03-30 ENCOUNTER — Other Ambulatory Visit: Payer: Self-pay

## 2020-03-30 ENCOUNTER — Ambulatory Visit (INDEPENDENT_AMBULATORY_CARE_PROVIDER_SITE_OTHER): Payer: Medicare HMO | Admitting: Orthopaedic Surgery

## 2020-03-30 ENCOUNTER — Encounter: Payer: Self-pay | Admitting: Orthopaedic Surgery

## 2020-03-30 VITALS — BP 102/65 | HR 93 | Temp 98.2°F | Ht 66.0 in | Wt 166.0 lb

## 2020-03-30 DIAGNOSIS — M25562 Pain in left knee: Secondary | ICD-10-CM | POA: Diagnosis not present

## 2020-03-30 DIAGNOSIS — G8929 Other chronic pain: Secondary | ICD-10-CM | POA: Diagnosis not present

## 2020-03-30 DIAGNOSIS — M25561 Pain in right knee: Secondary | ICD-10-CM | POA: Diagnosis not present

## 2020-03-30 NOTE — Progress Notes (Signed)
Patient [name]:[id number] Pearline Cables, female DOB:05/18/1946, 74 y.o. [id number]  Chief Complaint  Patient presents with  . Joint Swelling    Chronic bilateral knee pain    HPI  [name] is a 74 y.o. female who has bilateral knee pain.  She is much improved after the injection last time.  She stopped the Mobic as it raised her blood pressure.  She was told to try ibuprofen 600 one to two every other day. She is fearful about stomach problems.  She has no swelling today, no redness.     Body mass index is 26.79 kg/m.  ROS  Review of Systems  Constitutional: Positive for activity change.  Musculoskeletal: Positive for arthralgias, gait problem and joint swelling.  All other systems reviewed and are negative.   All other systems reviewed and are negative.  The following is a summary of the past history medically, past history surgically, known current medicines, social history and family history.  This information is gathered electronically by the computer from prior information and documentation.  I review this each visit and have found including this information at this point in the chart is beneficial and informative.    Past Medical History:  Diagnosis Date  . Anxiety   . Cataract   . Depression   . GERD (gastroesophageal reflux disease)   . Hyperlipidemia   . Hypothyroidism     Past Surgical History:  Procedure Laterality Date  . ABDOMINAL HYSTERECTOMY    . CATARACT EXTRACTION    . COLONOSCOPY N/A 09/02/2015   Procedure: COLONOSCOPY;  Surgeon: Rogene Houston, MD;  Location: [facility];  Service: Endoscopy;  Laterality: N/A;  930  . KNEE SURGERY      Family History  Problem Relation Age of Onset  . Cancer Mother        lung  . Stroke Father   . Heart disease Brother     Social History Social History   Tobacco Use  . Smoking status: Never Smoker  . Smokeless tobacco: Never Used  Substance Use Topics  . Alcohol use: No  . Drug use: No     Allergies  Allergen Reactions  . Lipitor [Atorvastatin Calcium] Other (See Comments)    Reaction:  Leg pain     Current Outpatient Medications  Medication Sig Dispense Refill  . ALPRAZolam (XANAX) 1 MG tablet Take 1 mg by mouth 4 (four) times daily as needed for anxiety or sleep.     Marland Kitchen aspirin 81 MG chewable tablet Chew 81 mg by mouth at bedtime.     Marland Kitchen loratadine (CLARITIN) 10 MG tablet Take 10 mg by mouth daily.    . meclizine (ANTIVERT) 25 MG tablet Take 1 tablet (25 mg total) by mouth 3 (three) times daily as needed for dizziness. 30 tablet 0  . omeprazole (PRILOSEC) 20 MG capsule Take 20 mg by mouth at bedtime as needed (for acid reflux).     . pravastatin (PRAVACHOL) 40 MG tablet Take 40 mg by mouth daily.    Marland Kitchen Specialty Vitamins Products (BIOTIN PLUS KERATIN) 10000-100 MCG-MG TABS Take 1 tablet by mouth daily.    Marland Kitchen estradiol (ESTRACE VAGINAL) 0.1 MG/GM vaginal cream Place [id number] Applicatorfuls vaginally 3 (three) times a week. This 0.5 g/ application (Patient not taking: Reported on 03/30/2020) 42.5 g 3  . meloxicam (MOBIC) 7.5 MG tablet      No current facility-administered medications for this visit.     Physical Exam  Blood pressure 102/65, pulse 93,  temperature 98.2 F (36.8 C), height 5\' 6"  (1.676 m), weight 166 lb (75.3 kg).  Constitutional: overall normal hygiene, normal nutrition, well developed, normal grooming, normal body habitus. Assistive device:none  Musculoskeletal: gait and station Limp none, muscle tone and strength are normal, no tremors or atrophy is present.  .  Neurological: coordination overall normal.  Deep tendon reflex/nerve stretch intact.  Sensation normal.  Cranial nerves II-XII intact.   Skin:   Normal overall no scars, lesions, ulcers or rashes. No psoriasis.  Psychiatric: Alert and oriented x 3.  Recent memory intact, remote memory unclear.  Normal mood and affect. Well groomed.  Good eye contact.  Cardiovascular: overall no swelling, no  varicosities, no edema bilaterally, normal temperatures of the legs and arms, no clubbing, cyanosis and good capillary refill.  Lymphatic: palpation is normal.  Both knee have minimal pain today.  She has crepitus of both knees but no effusion.  NV intact.  ROM is good.  She has no distal edema.   All other systems reviewed and are negative   The patient has been educated about the nature of the problem(s) and counseled on treatment options.  The patient appeared to understand what I have discussed and is in agreement with it.  Encounter Diagnoses  Name Primary?  . Chronic pain of right knee Yes  . Chronic pain of left knee     PLAN Call if any problems.  Precautions discussed.  Continue current medications. Take the ibuprofen as stated.  Return to clinic prn   Electronically Signed Sanjuana Kava, MD 4/27/20212:49 PM

## 2020-05-21 DIAGNOSIS — G894 Chronic pain syndrome: Secondary | ICD-10-CM | POA: Diagnosis not present

## 2020-05-21 DIAGNOSIS — M47815 Spondylosis without myelopathy or radiculopathy, thoracolumbar region: Secondary | ICD-10-CM | POA: Diagnosis not present

## 2020-05-21 DIAGNOSIS — M546 Pain in thoracic spine: Secondary | ICD-10-CM | POA: Diagnosis not present

## 2020-05-21 DIAGNOSIS — K219 Gastro-esophageal reflux disease without esophagitis: Secondary | ICD-10-CM | POA: Diagnosis not present

## 2020-05-21 DIAGNOSIS — Z0001 Encounter for general adult medical examination with abnormal findings: Secondary | ICD-10-CM | POA: Diagnosis not present

## 2020-05-21 DIAGNOSIS — Z6828 Body mass index (BMI) 28.0-28.9, adult: Secondary | ICD-10-CM | POA: Diagnosis not present

## 2020-05-21 DIAGNOSIS — Z1389 Encounter for screening for other disorder: Secondary | ICD-10-CM | POA: Diagnosis not present

## 2020-05-25 ENCOUNTER — Ambulatory Visit: Payer: Medicare HMO | Admitting: Orthopaedic Surgery

## 2020-05-26 ENCOUNTER — Encounter (HOSPITAL_COMMUNITY): Payer: Self-pay | Admitting: Emergency Medicine

## 2020-05-26 ENCOUNTER — Emergency Department (HOSPITAL_COMMUNITY): Payer: Medicare HMO

## 2020-05-26 ENCOUNTER — Emergency Department (HOSPITAL_COMMUNITY)
Admission: EM | Admit: 2020-05-26 | Discharge: 2020-05-26 | Disposition: A | Discharge status: Home / Self Care | Payer: Medicare HMO | Attending: Emergency Medicine | Admitting: Emergency Medicine

## 2020-05-26 ENCOUNTER — Other Ambulatory Visit: Payer: Self-pay

## 2020-05-26 DIAGNOSIS — S39012A Strain of muscle, fascia and tendon of lower back, initial encounter: Secondary | ICD-10-CM | POA: Insufficient documentation

## 2020-05-26 DIAGNOSIS — S3992XA Unspecified injury of lower back, initial encounter: Secondary | ICD-10-CM | POA: Diagnosis present

## 2020-05-26 DIAGNOSIS — E039 Hypothyroidism, unspecified: Secondary | ICD-10-CM | POA: Diagnosis not present

## 2020-05-26 DIAGNOSIS — Y93F2 Activity, caregiving, lifting: Secondary | ICD-10-CM | POA: Diagnosis not present

## 2020-05-26 DIAGNOSIS — X500XXA Overexertion from strenuous movement or load, initial encounter: Secondary | ICD-10-CM | POA: Insufficient documentation

## 2020-05-26 DIAGNOSIS — Y929 Unspecified place or not applicable: Secondary | ICD-10-CM | POA: Insufficient documentation

## 2020-05-26 DIAGNOSIS — Y999 Unspecified external cause status: Secondary | ICD-10-CM | POA: Insufficient documentation

## 2020-05-26 DIAGNOSIS — M545 Low back pain: Secondary | ICD-10-CM | POA: Diagnosis not present

## 2020-05-26 DIAGNOSIS — W269XXA Contact with unspecified sharp object(s), initial encounter: Secondary | ICD-10-CM | POA: Diagnosis not present

## 2020-05-26 MED ORDER — HYDROCODONE-ACETAMINOPHEN 5-325 MG PO TABS: 1.0000 | ORAL_TABLET | ORAL | 0 refills | Status: DC | PRN

## 2020-05-26 MED ORDER — DEXAMETHASONE SODIUM PHOSPHATE 10 MG/ML IJ SOLN
10.0000 mg | Freq: Once | INTRAMUSCULAR | Status: AC
  Administered 2020-05-26: 10 mg via INTRAMUSCULAR
  Filled 2020-05-26: qty 1

## 2020-05-26 MED ORDER — HYDROCODONE-ACETAMINOPHEN 5-325 MG PO TABS
1.0000 | ORAL_TABLET | Freq: Once | ORAL | Status: AC
  Administered 2020-05-26: 1 via ORAL
  Filled 2020-05-26: qty 1

## 2020-05-26 MED ORDER — PREDNISONE 10 MG PO TABS: ORAL_TABLET | ORAL | 0 refills | Status: DC

## 2020-05-26 NOTE — Discharge Instructions (Addendum)
Take your next dose of prednisone tomorrow morning.   Avoid lifting,  Bending,  Twisting or any other activity that worsens your pain over the next week.  Use a heating pad 20 minutes several times daily.  You may continue using the pain patch at the site as well.   Your xray shows mild arthritis but no other acute processes.   You should get rechecked if your symptoms are not better over the next 5 days,  Or you develop increased pain,  Weakness in your leg(s) or loss of bladder or bowel function - these can be symptoms of a worsening condition.   You have been prescribed a small quantity of pain medication.  This medication may make you drowsy, do not drive within 4 hours of taking it.  Also as discussed do not take it at least within 2 hours of a Xanax tablet.

## 2020-05-26 NOTE — ED Provider Notes (Signed)
[facility] EMERGENCY DEPARTMENT Provider Note   CSN: [id number] Arrival date & time: 05/26/20  1958     History Chief Complaint  Patient presents with  . Back Pain    [name] is a 74 y.o. female presenting with an approximate one month history of right lower back pain.  She describes a sharp stabbing pain that radiates across her right lower back with certain movements, particularly triggered when she twists her trunk and with lifting (describing turning to wipe after bm and lifting laundry basket are particularly painful). She has a history of kidney stones but denies this pain as similar.  She has found no alleviators, except had some improvement with a cortisone injection by her pcp several weeks ago.  She has tried rest, nsaids and lidocaine patch which she is currently wearing but not particularly helpful.  She denies fevers, chills, rash, the pain is not burning in quality.  Also denies radiation of pain into the leg, no dysuria or hematuria.  Also denies falls or other recognized injury.  The history is provided by the patient.       Past Medical History:  Diagnosis Date  . Anxiety   . Cataract   . Depression   . GERD (gastroesophageal reflux disease)   . Hyperlipidemia   . Hypothyroidism     There are no problems to display for this patient.   Past Surgical History:  Procedure Laterality Date  . ABDOMINAL HYSTERECTOMY    . CATARACT EXTRACTION    . COLONOSCOPY N/A 09/02/2015   Procedure: COLONOSCOPY;  Surgeon: Rogene Houston, MD;  Location: [facility];  Service: Endoscopy;  Laterality: N/A;  930  . KNEE SURGERY       OB History    Gravida  2   Para  2   Term  2   Preterm      AB      Living  2     SAB      TAB      Ectopic      Multiple      Live Births  2           Family History  Problem Relation Age of Onset  . Cancer Mother        lung  . Stroke Father   . Heart disease Brother     Social History   Tobacco Use    . Smoking status: Never Smoker  . Smokeless tobacco: Never Used  Vaping Use  . Vaping Use: Never used  Substance Use Topics  . Alcohol use: No  . Drug use: No    Home Medications Prior to Admission medications   Medication Sig Start Date End Date Taking? Authorizing Provider  ALPRAZolam Duanne Moron) 1 MG tablet Take 1 mg by mouth 4 (four) times daily as needed for anxiety or sleep.     [provider]  aspirin 81 MG chewable tablet Chew 81 mg by mouth at bedtime.     [provider]  estradiol (ESTRACE VAGINAL) 0.1 MG/GM vaginal cream Place 5.44 Applicatorfuls vaginally 3 (three) times a week. This 0.5 g/ application Patient not taking: Reported on 03/30/2020 03/10/20   Jonnie Kind, MD  HYDROcodone-acetaminophen (NORCO/VICODIN) 5-325 MG tablet Take 1 tablet by mouth every 4 (four) hours as needed for moderate pain. 05/26/20   Evalee Jefferson, PA-C  loratadine (CLARITIN) 10 MG tablet Take 10 mg by mouth daily.    [provider]  meclizine (  ANTIVERT) 25 MG tablet Take 1 tablet (25 mg total) by mouth 3 (three) times daily as needed for dizziness. 03/04/17   Lily Kocher, PA-C  meloxicam (MOBIC) 7.5 MG tablet  02/10/20   [provider]  omeprazole (PRILOSEC) 20 MG capsule Take 20 mg by mouth at bedtime as needed (for acid reflux).     [provider]  pravastatin (PRAVACHOL) 40 MG tablet Take 40 mg by mouth daily.    [provider]  predniSONE (DELTASONE) 10 MG tablet Take 6 tablets day one, 5 tablets day two, 4 tablets day three, 3 tablets day four, 2 tablets day five, then 1 tablet day six 05/26/20   Evalee Jefferson, PA-C  Specialty Vitamins Products (BIOTIN PLUS KERATIN) 10000-100 MCG-MG TABS Take 1 tablet by mouth daily.    [provider]    Allergies    Lipitor [atorvastatin calcium]  Review of Systems   Review of Systems  Constitutional: Negative for fever.  Respiratory: Negative for shortness of breath.   Cardiovascular:  Negative for chest pain and leg swelling.  Gastrointestinal: Negative for abdominal distention, abdominal pain and constipation.  Genitourinary: Negative for difficulty urinating, dysuria, flank pain, frequency and urgency.  Musculoskeletal: Positive for back pain. Negative for gait problem and joint swelling.  Skin: Negative for rash.  Neurological: Negative for weakness and numbness.    Physical Exam Updated Vital Signs BP 138/72 (BP Location: Right Arm)   Pulse 73   Temp 99 F (37.2 C)   Resp 18   Ht 5\' 6"  (1.676 m)   Wt 78.9 kg   SpO2 99%   BMI 28.08 kg/m   Physical Exam Vitals and nursing note reviewed.  Constitutional:      Appearance: She is well-developed.  HENT:     Head: Normocephalic.  Eyes:     Conjunctiva/sclera: Conjunctivae normal.  Cardiovascular:     Rate and Rhythm: Normal rate.     Comments: Pedal pulses normal. Pulmonary:     Effort: Pulmonary effort is normal.  Abdominal:     General: Bowel sounds are normal. There is no distension.     Palpations: Abdomen is soft. There is no pulsatile mass.     Tenderness: There is no abdominal tenderness.  Musculoskeletal:        General: Normal range of motion.     Cervical back: Normal range of motion and neck supple.     Lumbar back: Tenderness present. No swelling, edema or spasms.  Skin:    General: Skin is warm and dry.  Neurological:     General: No focal deficit present.     Mental Status: She is alert.     Sensory: No sensory deficit.     Motor: Motor function is intact. No tremor or atrophy.     Gait: Gait normal.     Comments: No strength deficit noted in hip and knee flexor and extensor muscle groups.  Ankle flexion and extension intact. Gait is normal without weakness, but has slow gait, holding her right lower back.      ED Results / Procedures / Treatments   Labs (all labs ordered are listed, but only abnormal results are displayed) Labs Reviewed - No data to  display  EKG None  Radiology DG Lumbar Spine Complete  Result Date: 05/26/2020 CLINICAL DATA:  74 year old female with back pain. Pain started with twisting motion. EXAM: LUMBAR SPINE - COMPLETE 4+ VIEW COMPARISON:  None. FINDINGS: Six lumbar type vertebra. There is no acute  fracture or subluxation of the lumbar spine. The bones are osteopenic. Mild multilevel degenerative changes. There is advanced atherosclerotic calcification of the abdominal aorta. The soft tissues are unremarkable. IMPRESSION: 1. No acute fracture or subluxation of the lumbar spine. 2. Osteopenia with mild multilevel degenerative changes. Electronically Signed   By: Anner Crete M.D.   On: 05/26/2020 22:10    Procedures Procedures (including critical care time)  Medications Ordered in ED Medications  dexamethasone (DECADRON) injection 10 mg (10 mg Intramuscular Given 05/26/20 2303)  HYDROcodone-acetaminophen (NORCO/VICODIN) 5-325 MG per tablet 1 tablet (1 tablet Oral Given 05/26/20 2303)    ED Course  I have reviewed the triage vital signs and the nursing notes.  Pertinent labs & imaging results that were available during my care of the patient were reviewed by me and considered in my medical decision making (see chart for details).    MDM Rules/Calculators/A&P                          Imaging reviewed and discussed with pt.Pt has no neuro deficits suggesting emergent or surgical presentation.  Suspect soft tissue msk source of pain.  She was given a decadron injection and a prednisone taper (advised to start the taper in 2 days since she received the injection tonight.  also discussed activities as tolerated, heat tx.  close f/u with pcp for a recheck within one week.   Pt may need eval by back specialist if sx persist.  she does have osteopenia in her spine which was discussed.  Small quantity of hydrocodone given for prn use, caution re sedation.   Outlined worsened sx that should prompt emergent re -eval.  Pt  has no leg weakness, no urinary or fecal incontinence/retention.     Final Clinical Impression(s) / ED Diagnoses Final diagnoses:  Strain of lumbar region, initial encounter    Rx / DC Orders ED Discharge Orders         Ordered    predniSONE (DELTASONE) 10 MG tablet     Discontinue  Reprint     05/26/20 2241    HYDROcodone-acetaminophen (NORCO/VICODIN) 5-325 MG tablet  Every 4 hours PRN,   Status:  Discontinued     Reprint     05/26/20 2253    HYDROcodone-acetaminophen (NORCO/VICODIN) 5-325 MG tablet  Every 4 hours PRN     Discontinue  Reprint     05/26/20 2254           Evalee Jefferson, PA-C 05/27/20 1227    Sherwood Gambler, MD 06/01/20 1037

## 2020-05-26 NOTE — ED Triage Notes (Addendum)
Pt here c/o lower back pain and a "catch" in her RIGHT side for several months. Pt seen by PCP on 6/17 for the same.

## 2020-06-02 DIAGNOSIS — E7849 Other hyperlipidemia: Secondary | ICD-10-CM | POA: Diagnosis not present

## 2020-06-02 DIAGNOSIS — M159 Polyosteoarthritis, unspecified: Secondary | ICD-10-CM | POA: Diagnosis not present

## 2020-06-02 DIAGNOSIS — F4542 Pain disorder with related psychological factors: Secondary | ICD-10-CM | POA: Diagnosis not present

## 2020-06-02 DIAGNOSIS — G894 Chronic pain syndrome: Secondary | ICD-10-CM | POA: Diagnosis not present

## 2020-06-21 DIAGNOSIS — Z1389 Encounter for screening for other disorder: Secondary | ICD-10-CM | POA: Diagnosis not present

## 2020-06-21 DIAGNOSIS — E663 Overweight: Secondary | ICD-10-CM | POA: Diagnosis not present

## 2020-06-21 DIAGNOSIS — Z0000 Encounter for general adult medical examination without abnormal findings: Secondary | ICD-10-CM | POA: Diagnosis not present

## 2020-06-21 DIAGNOSIS — Z0001 Encounter for general adult medical examination with abnormal findings: Secondary | ICD-10-CM | POA: Diagnosis not present

## 2020-06-21 DIAGNOSIS — Z6828 Body mass index (BMI) 28.0-28.9, adult: Secondary | ICD-10-CM | POA: Diagnosis not present

## 2020-08-03 DIAGNOSIS — F4542 Pain disorder with related psychological factors: Secondary | ICD-10-CM | POA: Diagnosis not present

## 2020-08-03 DIAGNOSIS — G894 Chronic pain syndrome: Secondary | ICD-10-CM | POA: Diagnosis not present

## 2020-08-03 DIAGNOSIS — M159 Polyosteoarthritis, unspecified: Secondary | ICD-10-CM | POA: Diagnosis not present

## 2020-08-03 DIAGNOSIS — E7849 Other hyperlipidemia: Secondary | ICD-10-CM | POA: Diagnosis not present

## 2020-08-31 ENCOUNTER — Encounter: Payer: Self-pay | Admitting: Orthopaedic Surgery

## 2020-08-31 ENCOUNTER — Other Ambulatory Visit: Payer: Self-pay

## 2020-08-31 ENCOUNTER — Ambulatory Visit: Payer: Medicare HMO | Admitting: Orthopaedic Surgery

## 2020-08-31 DIAGNOSIS — M25562 Pain in left knee: Secondary | ICD-10-CM | POA: Diagnosis not present

## 2020-08-31 DIAGNOSIS — M25561 Pain in right knee: Secondary | ICD-10-CM

## 2020-08-31 DIAGNOSIS — G8929 Other chronic pain: Secondary | ICD-10-CM

## 2020-08-31 NOTE — Progress Notes (Signed)
PROCEDURE NOTE:  The patient requests injections of the left knee , verbal consent was obtained.  The left knee was prepped appropriately after time out was performed.   Sterile technique was observed and injection of 1 cc of Depo-Medrol 40 mg with several cc's of plain xylocaine. Anesthesia was provided by ethyl chloride and a 20-gauge needle was used to inject the knee area. The injection was tolerated well.  A band aid dressing was applied.  The patient was advised to apply ice later today and tomorrow to the injection sight as needed.  PROCEDURE NOTE:  The patient requests injections of the right knee , verbal consent was obtained.  The right knee was prepped appropriately after time out was performed.   Sterile technique was observed and injection of 1 cc of Depo-Medrol 40 mg with several cc's of plain xylocaine. Anesthesia was provided by ethyl chloride and a 20-gauge needle was used to inject the knee area. The injection was tolerated well.  A band aid dressing was applied.  The patient was advised to apply ice later today and tomorrow to the injection sight as needed.  Return as needed.  Call if any problem.  Precautions discussed.   Electronically Signed Sanjuana Kava, MD 9/28/202110:51 AM

## 2020-09-02 ENCOUNTER — Encounter (INDEPENDENT_AMBULATORY_CARE_PROVIDER_SITE_OTHER): Payer: Self-pay | Admitting: *Deleted

## 2020-09-23 DIAGNOSIS — Z6828 Body mass index (BMI) 28.0-28.9, adult: Secondary | ICD-10-CM | POA: Diagnosis not present

## 2020-09-23 DIAGNOSIS — E063 Autoimmune thyroiditis: Secondary | ICD-10-CM | POA: Diagnosis not present

## 2020-09-23 DIAGNOSIS — Z23 Encounter for immunization: Secondary | ICD-10-CM | POA: Diagnosis not present

## 2020-09-23 DIAGNOSIS — F419 Anxiety disorder, unspecified: Secondary | ICD-10-CM | POA: Diagnosis not present

## 2020-09-23 DIAGNOSIS — M1991 Primary osteoarthritis, unspecified site: Secondary | ICD-10-CM | POA: Diagnosis not present

## 2020-09-23 DIAGNOSIS — K219 Gastro-esophageal reflux disease without esophagitis: Secondary | ICD-10-CM | POA: Diagnosis not present

## 2020-10-02 DIAGNOSIS — G894 Chronic pain syndrome: Secondary | ICD-10-CM | POA: Diagnosis not present

## 2020-10-02 DIAGNOSIS — E7849 Other hyperlipidemia: Secondary | ICD-10-CM | POA: Diagnosis not present

## 2020-10-02 DIAGNOSIS — M159 Polyosteoarthritis, unspecified: Secondary | ICD-10-CM | POA: Diagnosis not present

## 2020-10-02 DIAGNOSIS — F4542 Pain disorder with related psychological factors: Secondary | ICD-10-CM | POA: Diagnosis not present

## 2020-10-05 ENCOUNTER — Other Ambulatory Visit (INDEPENDENT_AMBULATORY_CARE_PROVIDER_SITE_OTHER): Payer: Self-pay | Admitting: *Deleted

## 2020-10-12 ENCOUNTER — Other Ambulatory Visit (INDEPENDENT_AMBULATORY_CARE_PROVIDER_SITE_OTHER): Payer: Self-pay

## 2020-10-12 DIAGNOSIS — Z86010 Personal history of colonic polyps: Secondary | ICD-10-CM

## 2020-10-15 ENCOUNTER — Telehealth (INDEPENDENT_AMBULATORY_CARE_PROVIDER_SITE_OTHER): Payer: Self-pay | Admitting: *Deleted

## 2020-10-15 ENCOUNTER — Encounter (INDEPENDENT_AMBULATORY_CARE_PROVIDER_SITE_OTHER): Payer: Self-pay | Admitting: *Deleted

## 2020-10-15 NOTE — Telephone Encounter (Signed)
Patient needs Plenvu (copay card) ° °

## 2020-10-18 MED ORDER — PLENVU 140 G PO SOLR: 1.0000 | Freq: Once | ORAL | 0 refills | Status: AC

## 2020-10-19 ENCOUNTER — Other Ambulatory Visit (HOSPITAL_COMMUNITY): Payer: Self-pay | Admitting: Internal Medicine

## 2020-10-19 DIAGNOSIS — Z1231 Encounter for screening mammogram for malignant neoplasm of breast: Secondary | ICD-10-CM

## 2020-10-25 ENCOUNTER — Telehealth (INDEPENDENT_AMBULATORY_CARE_PROVIDER_SITE_OTHER): Payer: Self-pay | Admitting: *Deleted

## 2020-10-25 NOTE — Telephone Encounter (Signed)
Referring MD/PCP: fusco   Procedure: tcs  Reason/Indication:  Hx polyps  Has patient had this procedure before?  Yes, 2016  If so, when, by whom and where?    Is there a family history of colon cancer?  no  Who?  What age when diagnosed?    Is patient diabetic?   no      Does patient have prosthetic heart valve or mechanical valve?  no  Do you have a pacemaker/defibrillator?  no  Has patient ever had endocarditis/atrial fibrillation? no  Does patient use oxygen? no  Has patient had joint replacement within last 12 months?  no  Is patient constipated or do they take laxatives? no  Does patient have a history of alcohol/drug use?  no  Is patient on blood thinner such as Coumadin, Plavix and/or Aspirin? no  Medications: alprazolam 1 mg qid, loratadine 10 mg daily, meclizine 25 mg prn, omeprazole 20 mg prn, pravastatin 40 mg bid, biotin daily, prenatal vitamins daily, vit c daily, tylenol daily, vit d3 daily   Allergies: lipitor  Medication Adjustment per Dr Rehman/Dr Jenetta Downer   Procedure date & time: 11/24/20

## 2020-11-02 DIAGNOSIS — E7849 Other hyperlipidemia: Secondary | ICD-10-CM | POA: Diagnosis not present

## 2020-11-02 DIAGNOSIS — G894 Chronic pain syndrome: Secondary | ICD-10-CM | POA: Diagnosis not present

## 2020-11-02 DIAGNOSIS — F4542 Pain disorder with related psychological factors: Secondary | ICD-10-CM | POA: Diagnosis not present

## 2020-11-02 DIAGNOSIS — M159 Polyosteoarthritis, unspecified: Secondary | ICD-10-CM | POA: Diagnosis not present

## 2020-11-10 ENCOUNTER — Ambulatory Visit (HOSPITAL_COMMUNITY)
Admission: RE | Admit: 2020-11-10 | Discharge: 2020-11-10 | Disposition: A | Discharge status: Home / Self Care | Payer: Medicare HMO | Source: Ambulatory Visit | Attending: Internal Medicine | Admitting: Internal Medicine

## 2020-11-10 ENCOUNTER — Other Ambulatory Visit: Payer: Self-pay

## 2020-11-10 DIAGNOSIS — Z1231 Encounter for screening mammogram for malignant neoplasm of breast: Secondary | ICD-10-CM | POA: Insufficient documentation

## 2020-11-19 NOTE — Patient Instructions (Signed)
[name]  11/19/2020     @PREFPERIOPPHARMACY @   Your procedure is scheduled on 11/24/2020.  Report to [name] at  [name]  A.M.  Call this number if you have problems the morning of surgery:  [phone]   Remember:  Follow the diet and pre instructions given to you by the office.                      Take these medicines the morning of surgery with A SIP OF WATER  Xanax(if needed), antivert, prilosec.    Do not wear jewelry, make-up or nail polish.  Do not wear lotions, powders, or perfumes. Please wear deodorant and brush your teeth.  Do not shave 48 hours prior to surgery.  Men may shave face and neck.  Do not bring valuables to the hospital.  [name] is not responsible for any belongings or valuables.  Contacts, dentures or bridgework may not be worn into surgery.  Leave your suitcase in the car.  After surgery it may be brought to your room.  For patients admitted to the hospital, discharge time will be determined by your treatment team.  Patients discharged the day of surgery will not be allowed to drive home.   Name and phone number of your driver:   family Special instructions:  DO NOT smoke the morning of your procedure.  Please read over the following fact sheets that you were given. Anesthesia Post-op Instructions and Care and Recovery After Surgery       Colonoscopy, Adult, Care After This sheet gives you information about how to care for yourself after your procedure. Your health care provider may also give you more specific instructions. If you have problems or questions, contact your health care provider. What can I expect after the procedure? After the procedure, it is common to have:  A small amount of blood in your stool for 24 hours after the procedure.  Some gas.  Mild cramping or bloating of your abdomen. Follow these instructions at home: Eating and drinking   Drink enough fluid to keep your urine pale yellow.  Follow  instructions from your health care provider about eating or drinking restrictions.  Resume your normal diet as instructed by your health care provider. Avoid heavy or fried foods that are hard to digest. Activity  Rest as told by your health care provider.  Avoid sitting for a long time without moving. Get up to take short walks every 1-2 hours. This is important to improve blood flow and breathing. Ask for help if you feel weak or unsteady.  Return to your normal activities as told by your health care provider. Ask your health care provider what activities are safe for you. Managing cramping and bloating   Try walking around when you have cramps or feel bloated.  Apply heat to your abdomen as told by your health care provider. Use the heat source that your health care provider recommends, such as a moist heat pack or a heating pad. ? Place a towel between your skin and the heat source. ? Leave the heat on for 20-30 minutes. ? Remove the heat if your skin turns bright red. This is especially important if you are unable to feel pain, heat, or cold. You may have a greater risk of getting burned. General instructions  For the first 24 hours after the procedure: ? Do not drive or use machinery. ? Do not sign  important documents. ? Do not drink alcohol. ? Do your regular daily activities at a slower pace than normal. ? Eat soft foods that are easy to digest.  Take over-the-counter and prescription medicines only as told by your health care provider.  Keep all follow-up visits as told by your health care provider. This is important. Contact a health care provider if:  You have blood in your stool 2-3 days after the procedure. Get help right away if you have:  More than a small spotting of blood in your stool.  Large blood clots in your stool.  Swelling of your abdomen.  Nausea or vomiting.  A fever.  Increasing pain in your abdomen that is not relieved with  medicine. Summary  After the procedure, it is common to have a small amount of blood in your stool. You may also have mild cramping and bloating of your abdomen.  For the first 24 hours after the procedure, do not drive or use machinery, sign important documents, or drink alcohol.  Get help right away if you have a lot of blood in your stool, nausea or vomiting, a fever, or increased pain in your abdomen. This information is not intended to replace advice given to you by your health care provider. Make sure you discuss any questions you have with your health care provider. Document Revised: 06/16/2019 Document Reviewed: 06/16/2019 Elsevier Patient Education  [name] After These instructions provide you with information about caring for yourself after your procedure. Your health care provider may also give you more specific instructions. Your treatment has been planned according to current medical practices, but problems sometimes occur. Call your health care provider if you have any problems or questions after your procedure. What can I expect after the procedure? After your procedure, you may:  Feel sleepy for several hours.  Feel clumsy and have poor balance for several hours.  Feel forgetful about what happened after the procedure.  Have poor judgment for several hours.  Feel nauseous or vomit.  Have a sore throat if you had a breathing tube during the procedure. Follow these instructions at home: For at least 24 hours after the procedure:      Have a responsible adult stay with you. It is important to have someone help care for you until you are awake and alert.  Rest as needed.  Do not: ? Participate in activities in which you could fall or become injured. ? Drive. ? Use heavy machinery. ? Drink alcohol. ? Take sleeping pills or medicines that cause drowsiness. ? Make important decisions or sign legal documents. ? Take care  of children on your own. Eating and drinking  Follow the diet that is recommended by your health care provider.  If you vomit, drink water, juice, or soup when you can drink without vomiting.  Make sure you have little or no nausea before eating solid foods. General instructions  Take over-the-counter and prescription medicines only as told by your health care provider.  If you have sleep apnea, surgery and certain medicines can increase your risk for breathing problems. Follow instructions from your health care provider about wearing your sleep device: ? Anytime you are sleeping, including during daytime naps. ? While taking prescription pain medicines, sleeping medicines, or medicines that make you drowsy.  If you smoke, do not smoke without supervision.  Keep all follow-up visits as told by your health care provider. This is important. Contact a health care provider if:  You keep feeling nauseous or you keep vomiting.  You feel light-headed.  You develop a rash.  You have a fever. Get help right away if:  You have trouble breathing. Summary  For several hours after your procedure, you may feel sleepy and have poor judgment.  Have a responsible adult stay with you for at least 24 hours or until you are awake and alert. This information is not intended to replace advice given to you by your health care provider. Make sure you discuss any questions you have with your health care provider. Document Revised: 02/18/2018 Document Reviewed: 03/12/2016 Elsevier Patient Education  [place].

## 2020-11-22 ENCOUNTER — Encounter (HOSPITAL_COMMUNITY): Payer: Self-pay

## 2020-11-22 ENCOUNTER — Encounter (HOSPITAL_COMMUNITY)
Admission: RE | Admit: 2020-11-22 | Discharge: 2020-11-22 | Disposition: A | Discharge status: Home / Self Care | Payer: Medicare HMO | Source: Ambulatory Visit | Attending: Internal Medicine | Admitting: Internal Medicine

## 2020-11-22 ENCOUNTER — Other Ambulatory Visit: Payer: Self-pay

## 2020-11-22 ENCOUNTER — Other Ambulatory Visit (HOSPITAL_COMMUNITY): Payer: Self-pay | Admitting: Physician Assistant

## 2020-11-22 ENCOUNTER — Other Ambulatory Visit (HOSPITAL_COMMUNITY)
Admission: RE | Admit: 2020-11-22 | Discharge: 2020-11-22 | Disposition: A | Discharge status: Home / Self Care | Payer: Medicare HMO | Source: Ambulatory Visit | Attending: Internal Medicine | Admitting: Internal Medicine

## 2020-11-22 DIAGNOSIS — R928 Other abnormal and inconclusive findings on diagnostic imaging of breast: Secondary | ICD-10-CM

## 2020-11-22 DIAGNOSIS — Z01812 Encounter for preprocedural laboratory examination: Secondary | ICD-10-CM | POA: Diagnosis not present

## 2020-11-22 DIAGNOSIS — Z20822 Contact with and (suspected) exposure to covid-19: Secondary | ICD-10-CM | POA: Insufficient documentation

## 2020-11-22 LAB — SARS CORONAVIRUS 2 (TAT 6-24 HRS): SARS Coronavirus 2: NEGATIVE

## 2020-11-24 ENCOUNTER — Ambulatory Visit (HOSPITAL_COMMUNITY): Payer: Medicare HMO | Admitting: Anesthesiology

## 2020-11-24 ENCOUNTER — Encounter (HOSPITAL_COMMUNITY): Payer: Self-pay | Admitting: Internal Medicine

## 2020-11-24 ENCOUNTER — Ambulatory Visit (HOSPITAL_COMMUNITY)
Admission: RE | Admit: 2020-11-24 | Discharge: 2020-11-24 | Disposition: A | Discharge status: Home / Self Care | Payer: Medicare HMO | Attending: Internal Medicine | Admitting: Internal Medicine

## 2020-11-24 ENCOUNTER — Encounter (HOSPITAL_COMMUNITY)
Admission: RE | Disposition: A | Discharge status: Home / Self Care | Payer: Self-pay | Source: Home / Self Care | Attending: Internal Medicine

## 2020-11-24 ENCOUNTER — Other Ambulatory Visit: Payer: Self-pay

## 2020-11-24 DIAGNOSIS — K219 Gastro-esophageal reflux disease without esophagitis: Secondary | ICD-10-CM | POA: Diagnosis not present

## 2020-11-24 DIAGNOSIS — K635 Polyp of colon: Secondary | ICD-10-CM | POA: Diagnosis not present

## 2020-11-24 DIAGNOSIS — Z8249 Family history of ischemic heart disease and other diseases of the circulatory system: Secondary | ICD-10-CM | POA: Diagnosis not present

## 2020-11-24 DIAGNOSIS — D125 Benign neoplasm of sigmoid colon: Secondary | ICD-10-CM | POA: Diagnosis not present

## 2020-11-24 DIAGNOSIS — Z79899 Other long term (current) drug therapy: Secondary | ICD-10-CM | POA: Insufficient documentation

## 2020-11-24 DIAGNOSIS — K5730 Diverticulosis of large intestine without perforation or abscess without bleeding: Secondary | ICD-10-CM | POA: Insufficient documentation

## 2020-11-24 DIAGNOSIS — Z823 Family history of stroke: Secondary | ICD-10-CM | POA: Insufficient documentation

## 2020-11-24 DIAGNOSIS — Z86010 Personal history of colonic polyps: Secondary | ICD-10-CM | POA: Diagnosis not present

## 2020-11-24 DIAGNOSIS — Z801 Family history of malignant neoplasm of trachea, bronchus and lung: Secondary | ICD-10-CM | POA: Diagnosis not present

## 2020-11-24 DIAGNOSIS — Z1211 Encounter for screening for malignant neoplasm of colon: Secondary | ICD-10-CM | POA: Diagnosis not present

## 2020-11-24 DIAGNOSIS — Z09 Encounter for follow-up examination after completed treatment for conditions other than malignant neoplasm: Secondary | ICD-10-CM | POA: Diagnosis not present

## 2020-11-24 DIAGNOSIS — D123 Benign neoplasm of transverse colon: Secondary | ICD-10-CM | POA: Diagnosis not present

## 2020-11-24 DIAGNOSIS — K644 Residual hemorrhoidal skin tags: Secondary | ICD-10-CM | POA: Insufficient documentation

## 2020-11-24 DIAGNOSIS — D124 Benign neoplasm of descending colon: Secondary | ICD-10-CM | POA: Diagnosis not present

## 2020-11-24 DIAGNOSIS — Z888 Allergy status to other drugs, medicaments and biological substances status: Secondary | ICD-10-CM | POA: Insufficient documentation

## 2020-11-24 HISTORY — PX: POLYPECTOMY: SHX5525

## 2020-11-24 HISTORY — PX: COLONOSCOPY WITH PROPOFOL: SHX5780

## 2020-11-24 HISTORY — PX: HOT HEMOSTASIS: SHX5433

## 2020-11-24 LAB — HM COLONOSCOPY

## 2020-11-24 SURGERY — COLONOSCOPY WITH PROPOFOL
Anesthesia: General

## 2020-11-24 MED ORDER — PROPOFOL 500 MG/50ML IV EMUL
INTRAVENOUS | Status: DC | PRN
  Administered 2020-11-24: 150 ug/kg/min via INTRAVENOUS

## 2020-11-24 MED ORDER — LACTATED RINGERS IV SOLN: INTRAVENOUS | Status: DC | PRN

## 2020-11-24 MED ORDER — PROPOFOL 10 MG/ML IV BOLUS
INTRAVENOUS | Status: DC | PRN
  Administered 2020-11-24: 40 mg via INTRAVENOUS

## 2020-11-24 MED ORDER — CHLORHEXIDINE GLUCONATE CLOTH 2 % EX PADS: 6.0000 | MEDICATED_PAD | Freq: Once | CUTANEOUS | Status: DC

## 2020-11-24 MED ORDER — PROPOFOL 10 MG/ML IV BOLUS
INTRAVENOUS | Status: AC
  Filled 2020-11-24: qty 40

## 2020-11-24 MED ORDER — SODIUM CHLORIDE FLUSH 0.9 % IV SOLN
INTRAVENOUS | Status: AC
  Filled 2020-11-24: qty 10

## 2020-11-24 MED ORDER — LIDOCAINE HCL (PF) 2 % IJ SOLN
INTRAMUSCULAR | Status: AC
  Filled 2020-11-24: qty 5

## 2020-11-24 MED ORDER — LACTATED RINGERS IV SOLN
Freq: Once | INTRAVENOUS | Status: AC
  Administered 2020-11-24: 08:00:00 1000 mL via INTRAVENOUS

## 2020-11-24 NOTE — H&P (Signed)
[name] is an 74 y.o. female.   Chief Complaint: Patient is here for colonoscopy. HPI: Patient is 74 year old Caucasian female with history of colonic adenomas and is here for surveillance colonoscopy.  She denies abdominal pain change in bowel habits or rectal bleeding.  She does not take aspirin regular basis. Patient's last colonoscopy was in September 2016 with removal of 4 polyps and 2 were tubular adenomas.  Third polyp was hyperplastic and the fourth polyp was lost. Family history is negative for CRC.  Past Medical History:  Diagnosis Date  . Anxiety   . Cataract   . Depression   . GERD (gastroesophageal reflux disease)   . Hyperlipidemia   . Hypothyroidism     Past Surgical History:  Procedure Laterality Date  . ABDOMINAL HYSTERECTOMY    . CATARACT EXTRACTION    . COLONOSCOPY N/A 09/02/2015   Procedure: COLONOSCOPY;  Surgeon: Rogene Houston, MD;  Location: [facility];  Service: Endoscopy;  Laterality: N/A;  930  . KNEE SURGERY      Family History  Problem Relation Age of Onset  . Cancer Mother        lung  . Stroke Father   . Heart disease Brother    Social History:  reports that she has never smoked. She has never used smokeless tobacco. She reports that she does not drink alcohol and does not use drugs.  Allergies:  Allergies  Allergen Reactions  . Lipitor [Atorvastatin Calcium] Other (See Comments)    Reaction:  Leg pain     Medications Prior to Admission  Medication Sig Dispense Refill  . acetaminophen (TYLENOL) 500 MG tablet Take 500 mg by mouth every 8 (eight) hours as needed for moderate pain.    Marland Kitchen ALPRAZolam (XANAX) 1 MG tablet Take 1 mg by mouth 4 (four) times daily as needed for anxiety or sleep.     Marland Kitchen ascorbic acid (VITAMIN C) 500 MG tablet Take 500 mg by mouth daily.    Marland Kitchen loratadine (CLARITIN) 10 MG tablet Take 10 mg by mouth daily as needed for allergies.    Marland Kitchen meclizine (ANTIVERT) 25 MG tablet Take 1 tablet (25 mg total) by mouth 3  (three) times daily as needed for dizziness. 30 tablet 0  . Multiple Vitamins-Minerals (HAIR/SKIN/NAILS) TABS Take 1 tablet by mouth daily.    Marland Kitchen omeprazole (PRILOSEC) 20 MG capsule Take 20 mg by mouth at bedtime as needed (for acid reflux).     . pravastatin (PRAVACHOL) 40 MG tablet Take 40 mg by mouth 2 (two) times a week.    . Prenatal Vit-Fe Fumarate-FA (PRENATAL MULTIVITAMIN) TABS tablet Take 1 tablet by mouth daily.    Marland Kitchen Specialty Vitamins Products (BIOTIN PLUS KERATIN) 10000-100 MCG-MG TABS Take 1 tablet by mouth daily.    . vitamin B-12 (CYANOCOBALAMIN) 1000 MCG tablet Take 1,000 mcg by mouth 2 (two) times a week.      Results for orders placed or performed during the hospital encounter of 11/22/20 (from the past 48 hour(s))  SARS CORONAVIRUS 2 (TAT 6-24 HRS) Nasopharyngeal Nasopharyngeal Swab     Status: None   Collection Time: 11/22/20  9:23 AM   Specimen: Nasopharyngeal Swab  Result Value Ref Range   SARS Coronavirus 2 NEGATIVE NEGATIVE    Comment: (NOTE) SARS-CoV-2 target nucleic acids are NOT DETECTED.  The SARS-CoV-2 RNA is generally detectable in upper and lower respiratory specimens during the acute phase of infection. Negative results do not preclude SARS-CoV-2 infection, do not  rule out co-infections with other pathogens, and should not be used as the sole basis for treatment or other patient management decisions. Negative results must be combined with clinical observations, patient history, and epidemiological information. The expected result is Negative.  Fact Sheet for Patients: SugarRoll.be  Fact Sheet for Healthcare Providers: https://www.woods-mathews.com/  This test is not yet approved or cleared by the [place] FDA and  has been authorized for detection and/or diagnosis of SARS-CoV-2 by FDA under an Emergency Use Authorization (EUA). This EUA will remain  in effect (meaning this test can be used) for the  duration of the COVID-19 declaration under Se ction 564(b)(1) of the Act, 21 U.S.C. section 360bbb-3(b)(1), unless the authorization is terminated or revoked sooner.  Performed at [facility], [place] [address] [place], [place] [address]    No results found.  Review of Systems  Blood pressure (!) 148/69, pulse 79, temperature 98.9 F (37.2 C), temperature source Oral, resp. rate 16, SpO2 99 %. Physical Exam HENT:     Mouth/Throat:     Mouth: Mucous membranes are moist.     Pharynx: Oropharynx is clear.  Eyes:     General: No scleral icterus.    Conjunctiva/sclera: Conjunctivae normal.  Cardiovascular:     Rate and Rhythm: Regular rhythm.     Heart sounds: Normal heart sounds. No murmur heard.   Pulmonary:     Effort: Pulmonary effort is normal.     Breath sounds: Normal breath sounds.  Abdominal:     General: There is no distension.     Palpations: Abdomen is soft. There is no mass.     Tenderness: There is no abdominal tenderness.  Musculoskeletal:        General: No swelling.     Cervical back: Neck supple.  Lymphadenopathy:     Cervical: No cervical adenopathy.  Skin:    General: Skin is dry.  Neurological:     Mental Status: She is alert.      Assessment/Plan  History of colonic adenomas. Surveillance colonoscopy.  Hildred Laser, MD 11/24/2020, 9:14 AM

## 2020-11-24 NOTE — Anesthesia Postprocedure Evaluation (Signed)
Anesthesia Post Note  Patient: JENAVEE LAGUARDIA  Procedure(s) Performed: COLONOSCOPY WITH PROPOFOL (N/A ) POLYPECTOMY HOT HEMOSTASIS (ARGON PLASMA COAGULATION/BICAP)  Patient location during evaluation: PACU Anesthesia Type: General Level of consciousness: awake, oriented, awake and alert and patient cooperative Pain management: satisfactory to patient Vital Signs Assessment: post-procedure vital signs reviewed and stable Respiratory status: spontaneous breathing, respiratory function stable and nonlabored ventilation Cardiovascular status: stable Postop Assessment: no apparent nausea or vomiting Anesthetic complications: no   No complications documented.   Last Vitals:  Vitals:   11/24/20 0821 11/24/20 1006  BP: (!) 148/69   Pulse: 79   Resp: 16 19  Temp: 37.2 C   SpO2: 99%     Last Pain:  Vitals:   11/24/20 0922  TempSrc:   PainSc: 0-No pain                 Francia Verry

## 2020-11-24 NOTE — Transfer of Care (Signed)
Immediate Anesthesia Transfer of Care Note  Patient: [name]  Procedure(s) Performed: COLONOSCOPY WITH PROPOFOL (N/A ) POLYPECTOMY HOT HEMOSTASIS (ARGON PLASMA COAGULATION/BICAP)  Patient Location: PACU  Anesthesia Type:General  Level of Consciousness: awake, alert , oriented and patient cooperative  Airway & Oxygen Therapy: Patient Spontanous Breathing  Post-op Assessment: Report given to RN, Post -op Vital signs reviewed and stable and Patient moving all extremities X 4  Post vital signs: Reviewed and stable  Last Vitals:  Vitals Value Taken Time  BP    Temp    Pulse 74 11/24/20 1006  Resp 13 11/24/20 1006  SpO2 98 % 11/24/20 1006  Vitals shown include unvalidated device data.  Last Pain:  Vitals:   11/24/20 0922  TempSrc:   PainSc: 0-No pain      Patients Stated Pain Goal: 5 (00/37/04 8889)  Complications: No complications documented.

## 2020-11-24 NOTE — Op Note (Signed)
[facility] Patient Name: [name] Procedure Date: 11/24/2020 9:01 AM MRN: [id number] Date of Birth: [birth year] Attending MD: Lionel December , MD CSN: [id number] Age: 74 Admit Type: Outpatient Procedure:                Colonoscopy Indications:              High risk colon cancer surveillance: Personal                            history of colonic polyps Providers:                Lionel December, MD, Loma Messing B. Patsy Lager, RN, Pandora Leiter, Technician Referring MD:             Elfredia Nevins, MD Medicines:                Propofol per Anesthesia Complications:            No immediate complications. Estimated Blood Loss:     Estimated blood loss was minimal. Procedure:                Pre-Anesthesia Assessment:                           - Prior to the procedure, a History and Physical                            was performed, and patient medications and                            allergies were reviewed. The patient's tolerance of                            previous anesthesia was also reviewed. The risks                            and benefits of the procedure and the sedation                            options and risks were discussed with the patient.                            All questions were answered, and informed consent                            was obtained. Prior Anticoagulants: The patient has                            taken no previous anticoagulant or antiplatelet                            agents. ASA Grade Assessment: II - A patient with  mild systemic disease. After reviewing the risks                            and benefits, the patient was deemed in                            satisfactory condition to undergo the procedure.                           After obtaining informed consent, the colonoscope                            was passed under direct vision. Throughout the                             procedure, the patient's blood pressure, pulse, and                            oxygen saturations were monitored continuously. The                            [id number]-[id number] ([id number]) scope was introduced through                            the anus and advanced to the the cecum, identified                            by appendiceal orifice and ileocecal valve. The                            colonoscopy was performed without difficulty. The                            patient tolerated the procedure well. The quality                            of the bowel preparation was good. The ileocecal                            valve, appendiceal orifice, and rectum were                            photographed. Scope In: 9:24:14 AM Scope Out: 10:00:50 AM Scope Withdrawal Time: 0 hours 30 minutes 49 seconds  Total Procedure Duration: 0 hours 36 minutes 36 seconds  Findings:      The perianal and digital rectal examinations were normal.      Six polyps were found in the sigmoid colon, descending colon, splenic       flexure and transverse colon. The polyps were small in size. These       polyps were removed with a cold snare. Resection and retrieval were       complete. The pathology specimen was placed into Bottle Number 1.      A small polyp was found in the hepatic flexure. The polyp was sessile.  Biopsies were taken with a cold forceps for histology. The pathology       specimen was placed into Bottle Number 1.      A 4 to 10 mm polyp was found in the mid sigmoid colon. The polyp was       flat. The polyp was removed with a piecemeal technique using a cold       snare [Voltage]. [Resection & Retrieval] [Retrieval Device]. [Clip       Device] [MRI Compatibility]. The pathology specimen was placed into       Bottle Number 2. Coagulation for destruction of remaining portion of       lesion using argon plasma was successful.      A few diverticula were found in the sigmoid colon.      External  hemorrhoids were found during retroflexion. The hemorrhoids       were small. Impression:               - Six small polyps in the sigmoid colon, in the                            descending colon, at the splenic flexure and in the                            transverse colon, removed with a cold snare.                            Resected and retrieved.                           - One small polyp at the hepatic flexure. Biopsied.                           - One 4 to 10 mm polyp in the mid sigmoid colon,                            removed piecemeal using a cold snare. Treated with                            argon plasma coagulation (APC).                           - Diverticulosis in the sigmoid colon.                           - External hemorrhoids.                           Comment: 8 polyps were removed altogether. Largest                            polyp was 10 x 4 mm sigmoid colon. Moderate Sedation:      Per Anesthesia Care Recommendation:           - Patient has a contact number available for  emergencies. The signs and symptoms of potential                            delayed complications were discussed with the                            patient. Return to normal activities tomorrow.                            Written discharge instructions were provided to the                            patient.                           - High fiber diet today.                           - Continue present medications.                           - No aspirin, ibuprofen, naproxen, or other                            non-steroidal anti-inflammatory drugs for 5 days.                           - Await pathology results.                           - Repeat colonoscopy is recommended. The                            colonoscopy date will be determined after pathology                            results from today's exam become available for                             review. Procedure Code(s):        --- Professional ---                           [phone], Colonoscopy, flexible; with removal of                            tumor(s), polyp(s), or other lesion(s) by snare                            technique                           45380, 59, Colonoscopy, flexible; with biopsy,                            single or multiple Diagnosis Code(s):        --- Professional ---  K63.5, Polyp of colon                           Z86.010, Personal history of colonic polyps                           K64.4, Residual hemorrhoidal skin tags                           K57.30, Diverticulosis of large intestine without                            perforation or abscess without bleeding CPT copyright 2019 American Medical Association. All rights reserved. The codes documented in this report are preliminary and upon coder review may  be revised to meet current compliance requirements. Lionel December, MD Lionel December, MD 11/24/2020 10:10:58 AM This report has been signed electronically. Number of Addenda: 0

## 2020-11-24 NOTE — Discharge Instructions (Signed)

## 2020-11-24 NOTE — Anesthesia Preprocedure Evaluation (Signed)
Anesthesia Evaluation  Patient identified by MRN, date of birth, ID band Patient awake    Reviewed: Allergy & Precautions, NPO status , Patient's Chart, lab work & pertinent test results  History of Anesthesia Complications Negative for: history of anesthetic complications  Airway Mallampati: II  TM Distance: >3 FB Neck ROM: Full    Dental  (+) Dental Advisory Given, Missing, Partial Upper   Pulmonary neg pulmonary ROS,    Pulmonary exam normal breath sounds clear to auscultation       Cardiovascular Exercise Tolerance: Good Normal cardiovascular exam Rhythm:Regular Rate:Normal     Neuro/Psych PSYCHIATRIC DISORDERS Anxiety Depression negative neurological ROS     GI/Hepatic Neg liver ROS, GERD  Medicated and Controlled,  Endo/Other  Hypothyroidism   Renal/GU negative Renal ROS  negative genitourinary   Musculoskeletal negative musculoskeletal ROS (+)   Abdominal   Peds negative pediatric ROS (+)  Hematology   Anesthesia Other Findings   Reproductive/Obstetrics negative OB ROS                           Anesthesia Physical Anesthesia Plan  ASA: II  Anesthesia Plan: General   Post-op Pain Management:    Induction: Intravenous  PONV Risk Score and Plan:   Airway Management Planned: Nasal Cannula and Natural Airway  Additional Equipment:   Intra-op Plan:   Post-operative Plan:   Informed Consent: I have reviewed the patients History and Physical, chart, labs and discussed the procedure including the risks, benefits and alternatives for the proposed anesthesia with the patient or authorized representative who has indicated his/her understanding and acceptance.     Dental advisory given  Plan Discussed with: CRNA and Surgeon  Anesthesia Plan Comments:         Anesthesia Quick Evaluation

## 2020-11-25 LAB — SURGICAL PATHOLOGY

## 2020-11-30 ENCOUNTER — Telehealth (INDEPENDENT_AMBULATORY_CARE_PROVIDER_SITE_OTHER): Payer: Self-pay | Admitting: Internal Medicine

## 2020-11-30 NOTE — Telephone Encounter (Signed)
I called the patient and spoke with her she states she no longer is in need of assistance as she states they called her this am with her pathology report and stated her polyps were benign. She says she mentioned to the Dr this am that had called her that she had seen some blood on her tissue the third day after colonoscopy and he advised it was probably due to the polyps being cut out and not cauterized, and she may have strained with her bm that caused the sight of blood. She states she has no longer seen any blood and does not think she needs assistance any further. I advised that if she did to please call our office and report this. She states understanding.

## 2020-11-30 NOTE — Telephone Encounter (Signed)
Patient left voice mail message stating she had a colonoscopy on 11/24/20 - would like to know if it is normal to still have a little blood in her stool  -  Please advise -  Ph# [phone]

## 2020-11-30 NOTE — Telephone Encounter (Signed)
Yes I had already called patient No action needed

## 2020-12-01 ENCOUNTER — Encounter (HOSPITAL_COMMUNITY): Payer: Self-pay | Admitting: Internal Medicine

## 2020-12-02 ENCOUNTER — Other Ambulatory Visit: Payer: Self-pay

## 2020-12-02 ENCOUNTER — Ambulatory Visit (HOSPITAL_COMMUNITY)
Admission: RE | Admit: 2020-12-02 | Discharge: 2020-12-02 | Disposition: A | Discharge status: Home / Self Care | Payer: Medicare HMO | Source: Ambulatory Visit | Attending: Physician Assistant | Admitting: Physician Assistant

## 2020-12-02 ENCOUNTER — Encounter (INDEPENDENT_AMBULATORY_CARE_PROVIDER_SITE_OTHER): Payer: Self-pay | Admitting: *Deleted

## 2020-12-02 DIAGNOSIS — R922 Inconclusive mammogram: Secondary | ICD-10-CM | POA: Diagnosis not present

## 2020-12-02 DIAGNOSIS — N6002 Solitary cyst of left breast: Secondary | ICD-10-CM | POA: Diagnosis not present

## 2020-12-02 DIAGNOSIS — R928 Other abnormal and inconclusive findings on diagnostic imaging of breast: Secondary | ICD-10-CM

## 2020-12-03 DIAGNOSIS — E7849 Other hyperlipidemia: Secondary | ICD-10-CM | POA: Diagnosis not present

## 2020-12-03 DIAGNOSIS — M159 Polyosteoarthritis, unspecified: Secondary | ICD-10-CM | POA: Diagnosis not present

## 2020-12-03 DIAGNOSIS — F4542 Pain disorder with related psychological factors: Secondary | ICD-10-CM | POA: Diagnosis not present

## 2020-12-03 DIAGNOSIS — G894 Chronic pain syndrome: Secondary | ICD-10-CM | POA: Diagnosis not present

## 2021-01-01 DIAGNOSIS — E7849 Other hyperlipidemia: Secondary | ICD-10-CM | POA: Diagnosis not present

## 2021-01-01 DIAGNOSIS — F4542 Pain disorder with related psychological factors: Secondary | ICD-10-CM | POA: Diagnosis not present

## 2021-01-01 DIAGNOSIS — M159 Polyosteoarthritis, unspecified: Secondary | ICD-10-CM | POA: Diagnosis not present

## 2021-01-01 DIAGNOSIS — G894 Chronic pain syndrome: Secondary | ICD-10-CM | POA: Diagnosis not present

## 2021-01-25 DIAGNOSIS — Z6827 Body mass index (BMI) 27.0-27.9, adult: Secondary | ICD-10-CM | POA: Diagnosis not present

## 2021-01-25 DIAGNOSIS — Z1331 Encounter for screening for depression: Secondary | ICD-10-CM | POA: Diagnosis not present

## 2021-01-25 DIAGNOSIS — F419 Anxiety disorder, unspecified: Secondary | ICD-10-CM | POA: Diagnosis not present

## 2021-01-25 DIAGNOSIS — E663 Overweight: Secondary | ICD-10-CM | POA: Diagnosis not present

## 2021-03-02 DIAGNOSIS — E7849 Other hyperlipidemia: Secondary | ICD-10-CM | POA: Diagnosis not present

## 2021-03-02 DIAGNOSIS — M159 Polyosteoarthritis, unspecified: Secondary | ICD-10-CM | POA: Diagnosis not present

## 2021-04-12 ENCOUNTER — Ambulatory Visit: Payer: Medicare HMO | Admitting: Orthopaedic Surgery

## 2021-04-12 ENCOUNTER — Encounter: Payer: Self-pay | Admitting: Orthopaedic Surgery

## 2021-04-12 ENCOUNTER — Other Ambulatory Visit: Payer: Self-pay

## 2021-04-12 DIAGNOSIS — M25561 Pain in right knee: Secondary | ICD-10-CM

## 2021-04-12 DIAGNOSIS — G8929 Other chronic pain: Secondary | ICD-10-CM | POA: Diagnosis not present

## 2021-04-12 DIAGNOSIS — M25562 Pain in left knee: Secondary | ICD-10-CM | POA: Diagnosis not present

## 2021-04-12 MED ORDER — PREDNISONE 5 MG (21) PO TBPK: ORAL_TABLET | ORAL | 0 refills | Status: DC

## 2021-04-12 NOTE — Progress Notes (Signed)
PROCEDURE NOTE:  The patient requests injections of the left knee , verbal consent was obtained.  The left knee was prepped appropriately after time out was performed.   Sterile technique was observed and injection of 1 cc of Celestone 6 mg with several cc's of plain xylocaine. Anesthesia was provided by ethyl chloride and a 20-gauge needle was used to inject the knee area. The injection was tolerated well.  A band aid dressing was applied.  The patient was advised to apply ice later today and tomorrow to the injection sight as needed.  PROCEDURE NOTE:  The patient requests injections of the right knee , verbal consent was obtained.  The right knee was prepped appropriately after time out was performed.   Sterile technique was observed and injection of 1 cc of Celestone 6 mg with several cc's of plain xylocaine. Anesthesia was provided by ethyl chloride and a 20-gauge needle was used to inject the knee area. The injection was tolerated well.  A band aid dressing was applied.  The patient was advised to apply ice later today and tomorrow to the injection sight as needed.  Return prn.  I did call in prednisone dose pack to take in future as needed.  Call if any problem.  Precautions discussed.   Electronically Signed Sanjuana Kava, MD 5/10/20221:46 PM

## 2021-05-03 DIAGNOSIS — M159 Polyosteoarthritis, unspecified: Secondary | ICD-10-CM | POA: Diagnosis not present

## 2021-05-03 DIAGNOSIS — E7849 Other hyperlipidemia: Secondary | ICD-10-CM | POA: Diagnosis not present

## 2021-06-02 DIAGNOSIS — M159 Polyosteoarthritis, unspecified: Secondary | ICD-10-CM | POA: Diagnosis not present

## 2021-06-02 DIAGNOSIS — E782 Mixed hyperlipidemia: Secondary | ICD-10-CM | POA: Diagnosis not present

## 2021-06-09 DIAGNOSIS — Z0001 Encounter for general adult medical examination with abnormal findings: Secondary | ICD-10-CM | POA: Diagnosis not present

## 2021-06-09 DIAGNOSIS — M1991 Primary osteoarthritis, unspecified site: Secondary | ICD-10-CM | POA: Diagnosis not present

## 2021-06-09 DIAGNOSIS — E663 Overweight: Secondary | ICD-10-CM | POA: Diagnosis not present

## 2021-06-09 DIAGNOSIS — G894 Chronic pain syndrome: Secondary | ICD-10-CM | POA: Diagnosis not present

## 2021-06-09 DIAGNOSIS — E782 Mixed hyperlipidemia: Secondary | ICD-10-CM | POA: Diagnosis not present

## 2021-06-09 DIAGNOSIS — Z1331 Encounter for screening for depression: Secondary | ICD-10-CM | POA: Diagnosis not present

## 2021-06-09 DIAGNOSIS — E063 Autoimmune thyroiditis: Secondary | ICD-10-CM | POA: Diagnosis not present

## 2021-06-09 DIAGNOSIS — Z6827 Body mass index (BMI) 27.0-27.9, adult: Secondary | ICD-10-CM | POA: Diagnosis not present

## 2021-06-09 DIAGNOSIS — F419 Anxiety disorder, unspecified: Secondary | ICD-10-CM | POA: Diagnosis not present

## 2021-06-13 DIAGNOSIS — E063 Autoimmune thyroiditis: Secondary | ICD-10-CM | POA: Diagnosis not present

## 2021-06-13 DIAGNOSIS — Z0001 Encounter for general adult medical examination with abnormal findings: Secondary | ICD-10-CM | POA: Diagnosis not present

## 2021-06-13 DIAGNOSIS — E782 Mixed hyperlipidemia: Secondary | ICD-10-CM | POA: Diagnosis not present

## 2021-06-13 DIAGNOSIS — E559 Vitamin D deficiency, unspecified: Secondary | ICD-10-CM | POA: Diagnosis not present

## 2021-07-03 DIAGNOSIS — E782 Mixed hyperlipidemia: Secondary | ICD-10-CM | POA: Diagnosis not present

## 2021-07-03 DIAGNOSIS — M159 Polyosteoarthritis, unspecified: Secondary | ICD-10-CM | POA: Diagnosis not present

## 2021-07-05 DIAGNOSIS — R609 Edema, unspecified: Secondary | ICD-10-CM | POA: Diagnosis not present

## 2021-07-29 DIAGNOSIS — Z6828 Body mass index (BMI) 28.0-28.9, adult: Secondary | ICD-10-CM | POA: Diagnosis not present

## 2021-07-29 DIAGNOSIS — I872 Venous insufficiency (chronic) (peripheral): Secondary | ICD-10-CM | POA: Diagnosis not present

## 2021-07-29 DIAGNOSIS — E663 Overweight: Secondary | ICD-10-CM | POA: Diagnosis not present

## 2021-07-29 DIAGNOSIS — M159 Polyosteoarthritis, unspecified: Secondary | ICD-10-CM | POA: Diagnosis not present

## 2021-08-22 DIAGNOSIS — E663 Overweight: Secondary | ICD-10-CM | POA: Diagnosis not present

## 2021-08-22 DIAGNOSIS — M1991 Primary osteoarthritis, unspecified site: Secondary | ICD-10-CM | POA: Diagnosis not present

## 2021-08-22 DIAGNOSIS — Z6827 Body mass index (BMI) 27.0-27.9, adult: Secondary | ICD-10-CM | POA: Diagnosis not present

## 2021-08-22 DIAGNOSIS — G894 Chronic pain syndrome: Secondary | ICD-10-CM | POA: Diagnosis not present

## 2021-08-22 DIAGNOSIS — E063 Autoimmune thyroiditis: Secondary | ICD-10-CM | POA: Diagnosis not present

## 2021-09-08 ENCOUNTER — Other Ambulatory Visit: Payer: Self-pay

## 2021-09-08 ENCOUNTER — Encounter: Payer: Self-pay | Admitting: Orthopaedic Surgery

## 2021-09-08 ENCOUNTER — Ambulatory Visit: Payer: Medicare HMO | Admitting: Orthopaedic Surgery

## 2021-09-08 DIAGNOSIS — M25562 Pain in left knee: Secondary | ICD-10-CM

## 2021-09-08 DIAGNOSIS — G8929 Other chronic pain: Secondary | ICD-10-CM | POA: Diagnosis not present

## 2021-09-08 DIAGNOSIS — M25561 Pain in right knee: Secondary | ICD-10-CM

## 2021-09-08 NOTE — Patient Instructions (Signed)
Aspercreme with lidocaine

## 2021-09-08 NOTE — Progress Notes (Signed)
PROCEDURE NOTE:  The patient requests injections of the right knee , verbal consent was obtained.  The right knee was prepped appropriately after time out was performed.   Sterile technique was observed and injection of 1 cc of Celestone 6mg  with several cc's of plain xylocaine. Anesthesia was provided by ethyl chloride and a 20-gauge needle was used to inject the knee area. The injection was tolerated well.  A band aid dressing was applied.  The patient was advised to apply ice later today and tomorrow to the injection sight as needed.  PROCEDURE NOTE:  The patient requests injections of the left knee , verbal consent was obtained.  The left knee was prepped appropriately after time out was performed.   Sterile technique was observed and injection of 1 cc of Celestone 6 mg with several cc's of plain xylocaine. Anesthesia was provided by ethyl chloride and a 20-gauge needle was used to inject the knee area. The injection was tolerated well.  A band aid dressing was applied.  The patient was advised to apply ice later today and tomorrow to the injection sight as needed.   Return as needed.  Use Aspercreme plus Lidocaine.  Call if any problem.  Precautions discussed.  Electronically Signed Sanjuana Kava, MD 10/6/202211:02 AM

## 2021-10-06 ENCOUNTER — Ambulatory Visit: Payer: Medicare HMO | Admitting: Orthopaedic Surgery

## 2021-10-06 DIAGNOSIS — E063 Autoimmune thyroiditis: Secondary | ICD-10-CM | POA: Diagnosis not present

## 2021-10-06 DIAGNOSIS — Z23 Encounter for immunization: Secondary | ICD-10-CM | POA: Diagnosis not present

## 2021-10-06 DIAGNOSIS — G894 Chronic pain syndrome: Secondary | ICD-10-CM | POA: Diagnosis not present

## 2021-10-06 DIAGNOSIS — M159 Polyosteoarthritis, unspecified: Secondary | ICD-10-CM | POA: Diagnosis not present

## 2021-10-06 DIAGNOSIS — Z6826 Body mass index (BMI) 26.0-26.9, adult: Secondary | ICD-10-CM | POA: Diagnosis not present

## 2021-10-06 DIAGNOSIS — F1120 Opioid dependence, uncomplicated: Secondary | ICD-10-CM | POA: Diagnosis not present

## 2022-01-26 DIAGNOSIS — Z1331 Encounter for screening for depression: Secondary | ICD-10-CM | POA: Diagnosis not present

## 2022-01-26 DIAGNOSIS — F1120 Opioid dependence, uncomplicated: Secondary | ICD-10-CM | POA: Diagnosis not present

## 2022-01-26 DIAGNOSIS — E663 Overweight: Secondary | ICD-10-CM | POA: Diagnosis not present

## 2022-01-26 DIAGNOSIS — Z0001 Encounter for general adult medical examination with abnormal findings: Secondary | ICD-10-CM | POA: Diagnosis not present

## 2022-01-26 DIAGNOSIS — F329 Major depressive disorder, single episode, unspecified: Secondary | ICD-10-CM | POA: Diagnosis not present

## 2022-01-26 DIAGNOSIS — F1390 Sedative, hypnotic, or anxiolytic use, unspecified, uncomplicated: Secondary | ICD-10-CM | POA: Diagnosis not present

## 2022-01-26 DIAGNOSIS — E063 Autoimmune thyroiditis: Secondary | ICD-10-CM | POA: Diagnosis not present

## 2022-01-26 DIAGNOSIS — E782 Mixed hyperlipidemia: Secondary | ICD-10-CM | POA: Diagnosis not present

## 2022-01-26 DIAGNOSIS — Z6825 Body mass index (BMI) 25.0-25.9, adult: Secondary | ICD-10-CM | POA: Diagnosis not present

## 2022-03-16 ENCOUNTER — Other Ambulatory Visit (HOSPITAL_COMMUNITY): Payer: Self-pay | Admitting: Internal Medicine

## 2022-03-16 DIAGNOSIS — N6002 Solitary cyst of left breast: Secondary | ICD-10-CM

## 2022-03-22 DIAGNOSIS — G894 Chronic pain syndrome: Secondary | ICD-10-CM | POA: Diagnosis not present

## 2022-03-22 DIAGNOSIS — M159 Polyosteoarthritis, unspecified: Secondary | ICD-10-CM | POA: Diagnosis not present

## 2022-03-22 DIAGNOSIS — E063 Autoimmune thyroiditis: Secondary | ICD-10-CM | POA: Diagnosis not present

## 2022-03-22 DIAGNOSIS — Z6825 Body mass index (BMI) 25.0-25.9, adult: Secondary | ICD-10-CM | POA: Diagnosis not present

## 2022-03-22 DIAGNOSIS — N63 Unspecified lump in unspecified breast: Secondary | ICD-10-CM | POA: Diagnosis not present

## 2022-04-04 ENCOUNTER — Ambulatory Visit (HOSPITAL_COMMUNITY)
Admission: RE | Admit: 2022-04-04 | Discharge: 2022-04-04 | Disposition: A | Discharge status: Home / Self Care | Payer: Medicare HMO | Source: Ambulatory Visit | Attending: Internal Medicine | Admitting: Internal Medicine

## 2022-04-04 ENCOUNTER — Other Ambulatory Visit (HOSPITAL_COMMUNITY): Payer: Self-pay | Admitting: Internal Medicine

## 2022-04-04 DIAGNOSIS — R928 Other abnormal and inconclusive findings on diagnostic imaging of breast: Secondary | ICD-10-CM | POA: Diagnosis not present

## 2022-04-04 DIAGNOSIS — N6002 Solitary cyst of left breast: Secondary | ICD-10-CM | POA: Insufficient documentation

## 2022-04-04 DIAGNOSIS — N6320 Unspecified lump in the left breast, unspecified quadrant: Secondary | ICD-10-CM

## 2022-05-09 ENCOUNTER — Encounter: Payer: Self-pay | Admitting: Orthopaedic Surgery

## 2022-05-09 ENCOUNTER — Ambulatory Visit: Payer: Medicare HMO | Admitting: Orthopaedic Surgery

## 2022-05-09 DIAGNOSIS — G8929 Other chronic pain: Secondary | ICD-10-CM

## 2022-05-09 DIAGNOSIS — M25561 Pain in right knee: Secondary | ICD-10-CM

## 2022-05-09 DIAGNOSIS — M25562 Pain in left knee: Secondary | ICD-10-CM

## 2022-05-09 NOTE — Progress Notes (Signed)
PROCEDURE NOTE:  The patient requests injections of the left knee , verbal consent was obtained.  The left knee was prepped appropriately after time out was performed.   Sterile technique was observed and injection of 1 cc of DepoMedrol 40 mg with several cc's of plain xylocaine. Anesthesia was provided by ethyl chloride and a 20-gauge needle was used to inject the knee area. The injection was tolerated well.  A band aid dressing was applied.  The patient was advised to apply ice later today and tomorrow to the injection sight as needed.  PROCEDURE NOTE:  The patient requests injections of the right knee , verbal consent was obtained.  The right knee was prepped appropriately after time out was performed.   Sterile technique was observed and injection of 1 cc of DepoMedrol '40mg'$  with several cc's of plain xylocaine. Anesthesia was provided by ethyl chloride and a 20-gauge needle was used to inject the knee area. The injection was tolerated well.  A band aid dressing was applied.  The patient was advised to apply ice later today and tomorrow to the injection sight as needed.  Encounter Diagnosis  Name Primary?   Chronic pain of both knees Yes   Return prn.  Call if any problem.  Precautions discussed.  Electronically Signed Sanjuana Kava, MD 6/6/20239:51 AM

## 2022-07-12 IMAGING — MG DIGITAL DIAGNOSTIC BILAT W/ TOMO W/ CAD
6 of 10 series · 6 of 30 positions shown · non-contrast
Comparison: Previous exams.

CLINICAL DATA: 76-year-old female with a palpable area of concern
in the left breast breast. History of benign cyst seen in the left
breast at 12 o'clock retroareolar, most recently imaged November 2020.

EXAM:
DIGITAL DIAGNOSTIC BILATERAL MAMMOGRAM WITH TOMOSYNTHESIS AND CAD;
ULTRASOUND LEFT BREAST LIMITED
TECHNIQUE: Bilateral digital diagnostic mammography and breast tomosynthesis
was performed. The images were evaluated with computer-aided
detection.; Targeted ultrasound examination of the left breast was
performed.

[L CC synth-2D]
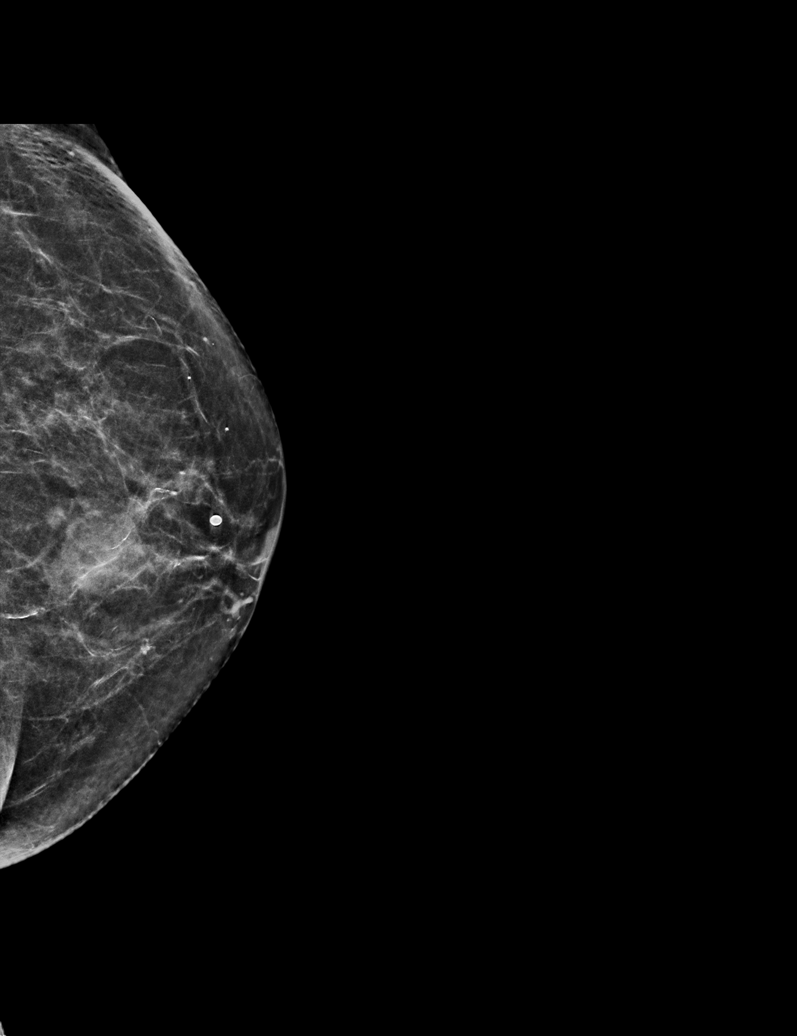

[R CC synth-2D]
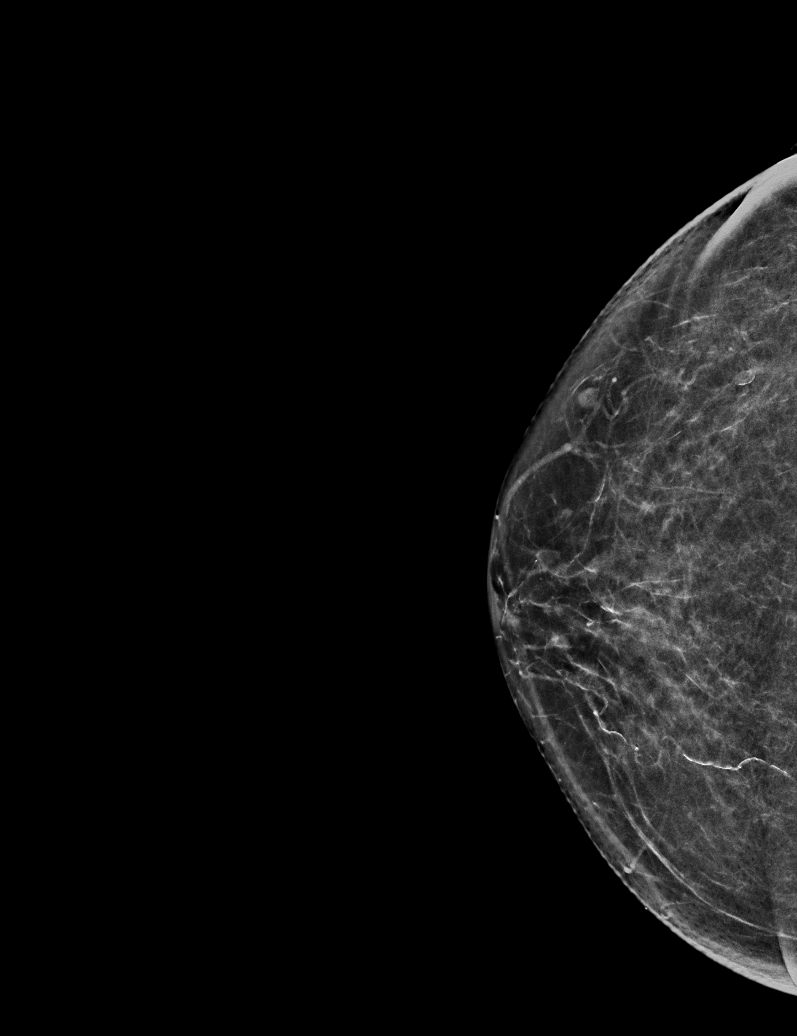

[R MLO synth-2D]
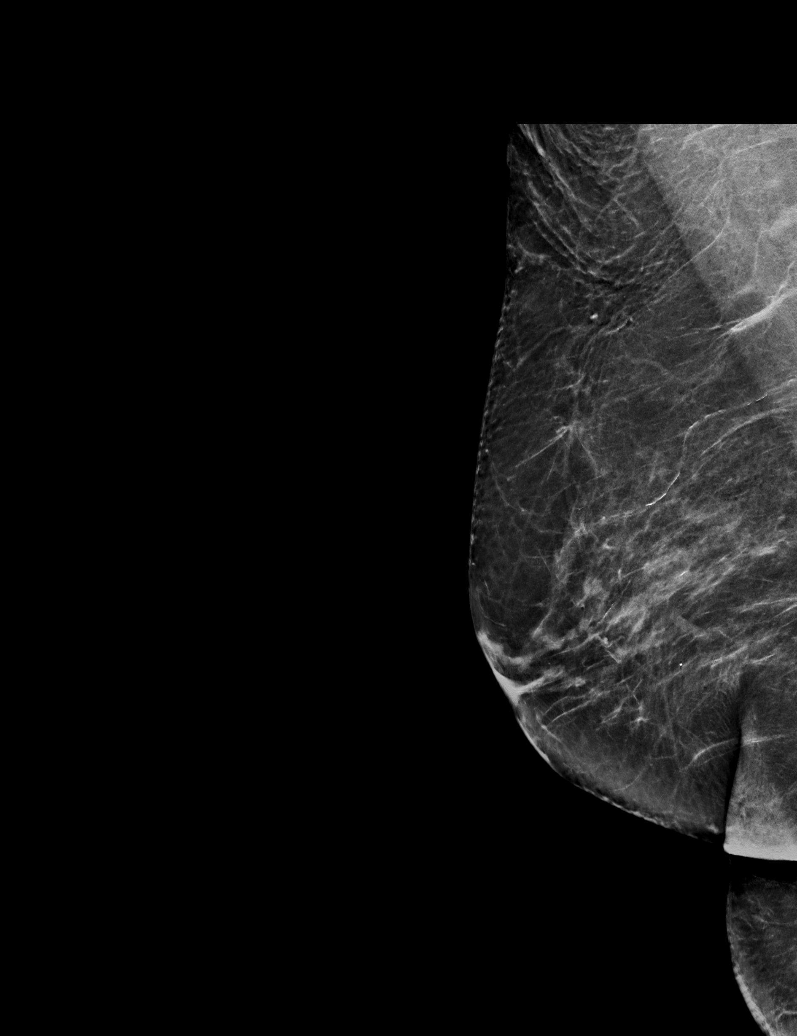

[L MLO synth-2D (1 of 2)]
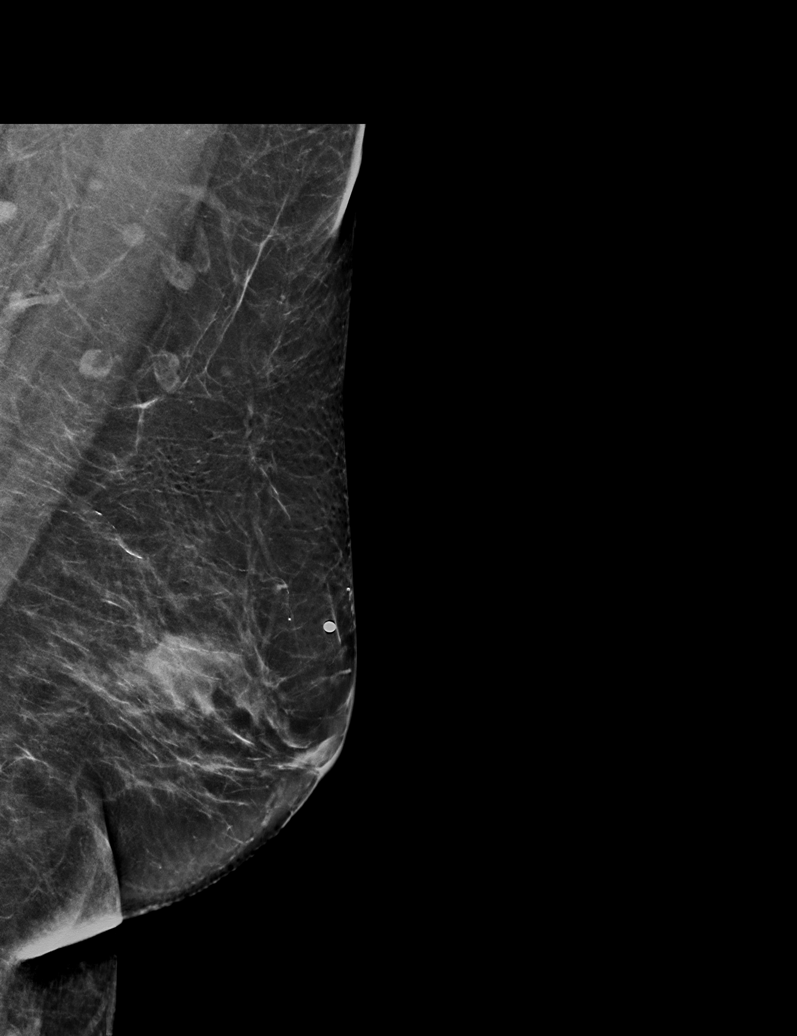

[L MLO synth-2D (2 of 2)]
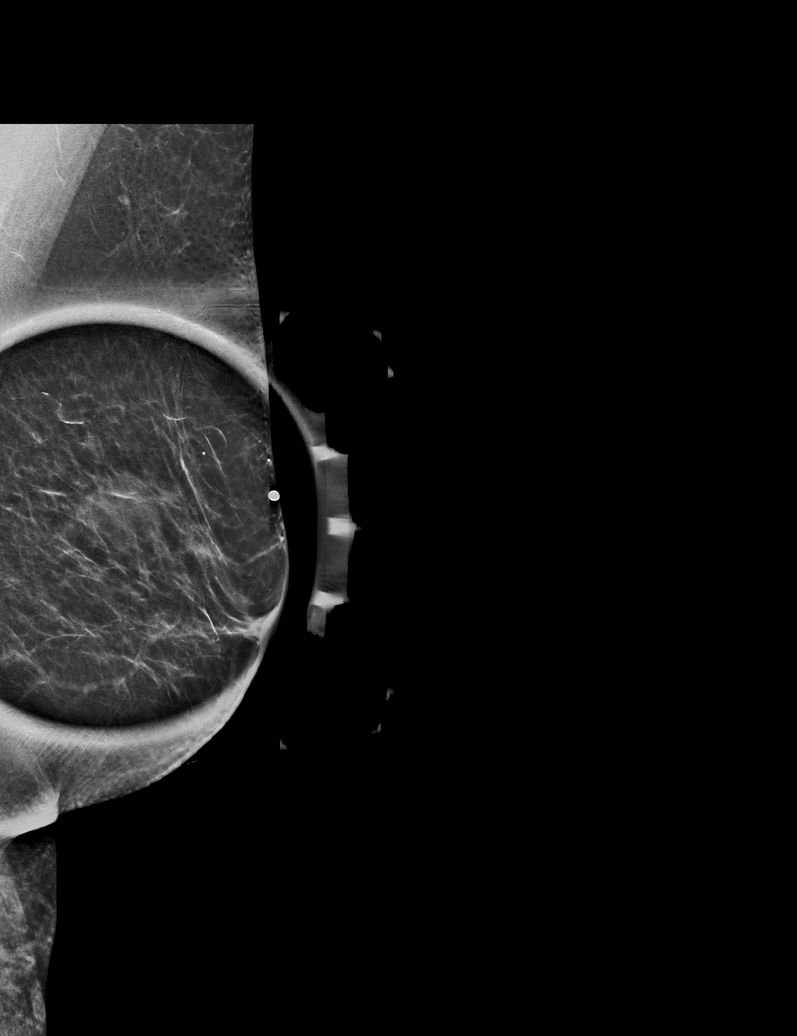

[R MLO tomo · tomo slice 33/64.0]
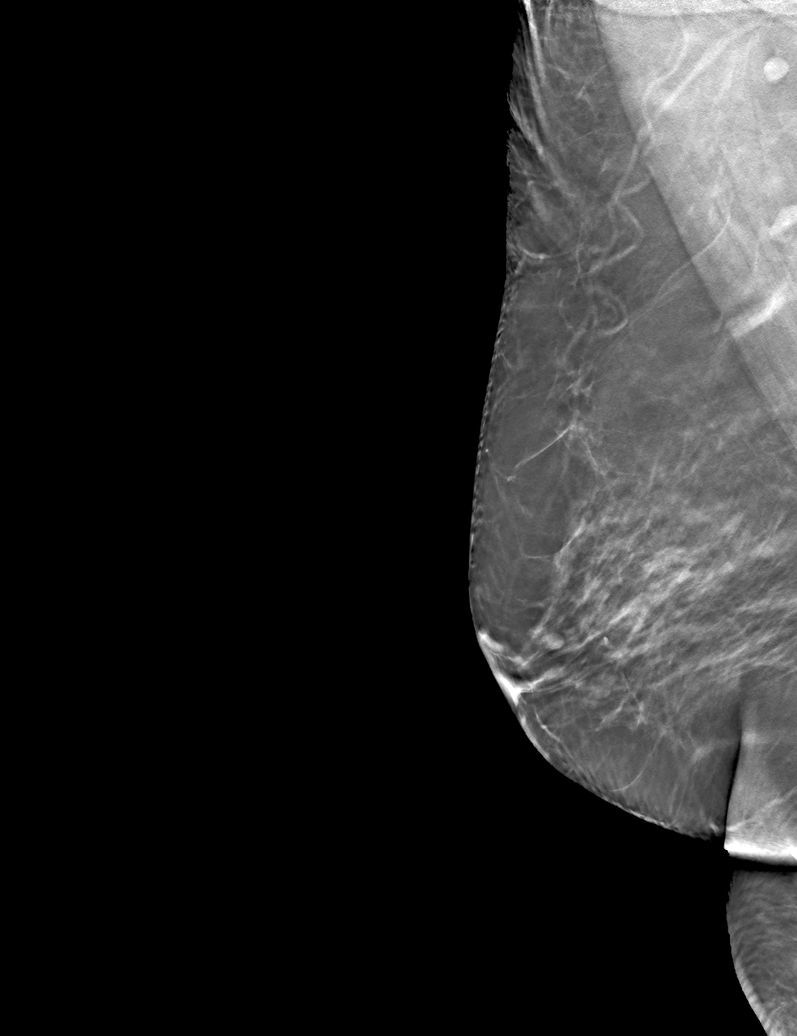

[6 of 30 positions shown; findings below may reference images not displayed]

ACR Breast Density Category b: There are scattered areas of
fibroglandular density.
FINDINGS: No suspicious masses or calcifications are seen in either breast.
Spot compression tomograms were performed over the palpable area of
concern demonstrating an oval circumscribed retroareolar mass,
similar in appearance when compared to prior exam.

Targeted ultrasound the site of palpable concern in the central to
upper left breast was performed demonstrating a simple cyst at 12
o'clock retroareolar measuring 1.4 x 0.8 x 1.5 cm. This is overall
unchanged in size and appearance when compared to the prior
ultrasounds.
IMPRESSION: Left breast cyst.  No findings of malignancy in either breast.

RECOMMENDATION:
Screening mammogram in one year.(Code:25-5-BM0)

I have discussed the findings and recommendations with the patient.
If applicable, a reminder letter will be sent to the patient
regarding the next appointment.

BI-RADS CATEGORY  2: Benign.

## 2022-07-25 DIAGNOSIS — F1390 Sedative, hypnotic, or anxiolytic use, unspecified, uncomplicated: Secondary | ICD-10-CM | POA: Diagnosis not present

## 2022-07-25 DIAGNOSIS — F1120 Opioid dependence, uncomplicated: Secondary | ICD-10-CM | POA: Diagnosis not present

## 2022-07-25 DIAGNOSIS — E663 Overweight: Secondary | ICD-10-CM | POA: Diagnosis not present

## 2022-07-25 DIAGNOSIS — Z6825 Body mass index (BMI) 25.0-25.9, adult: Secondary | ICD-10-CM | POA: Diagnosis not present

## 2022-07-25 DIAGNOSIS — G894 Chronic pain syndrome: Secondary | ICD-10-CM | POA: Diagnosis not present

## 2022-07-25 DIAGNOSIS — M159 Polyosteoarthritis, unspecified: Secondary | ICD-10-CM | POA: Diagnosis not present

## 2022-07-25 DIAGNOSIS — E063 Autoimmune thyroiditis: Secondary | ICD-10-CM | POA: Diagnosis not present

## 2022-11-02 DIAGNOSIS — F419 Anxiety disorder, unspecified: Secondary | ICD-10-CM | POA: Diagnosis not present

## 2022-11-02 DIAGNOSIS — M1991 Primary osteoarthritis, unspecified site: Secondary | ICD-10-CM | POA: Diagnosis not present

## 2022-11-02 DIAGNOSIS — Z6825 Body mass index (BMI) 25.0-25.9, adult: Secondary | ICD-10-CM | POA: Diagnosis not present

## 2022-11-02 DIAGNOSIS — E663 Overweight: Secondary | ICD-10-CM | POA: Diagnosis not present

## 2022-11-02 DIAGNOSIS — G894 Chronic pain syndrome: Secondary | ICD-10-CM | POA: Diagnosis not present

## 2022-11-02 DIAGNOSIS — M159 Polyosteoarthritis, unspecified: Secondary | ICD-10-CM | POA: Diagnosis not present

## 2023-01-15 DIAGNOSIS — E663 Overweight: Secondary | ICD-10-CM | POA: Diagnosis not present

## 2023-01-15 DIAGNOSIS — Z6825 Body mass index (BMI) 25.0-25.9, adult: Secondary | ICD-10-CM | POA: Diagnosis not present

## 2023-01-15 DIAGNOSIS — G894 Chronic pain syndrome: Secondary | ICD-10-CM | POA: Diagnosis not present

## 2023-03-14 DIAGNOSIS — Z6825 Body mass index (BMI) 25.0-25.9, adult: Secondary | ICD-10-CM | POA: Diagnosis not present

## 2023-03-14 DIAGNOSIS — Z1331 Encounter for screening for depression: Secondary | ICD-10-CM | POA: Diagnosis not present

## 2023-03-14 DIAGNOSIS — Z0001 Encounter for general adult medical examination with abnormal findings: Secondary | ICD-10-CM | POA: Diagnosis not present

## 2023-03-14 DIAGNOSIS — M1991 Primary osteoarthritis, unspecified site: Secondary | ICD-10-CM | POA: Diagnosis not present

## 2023-03-14 DIAGNOSIS — D518 Other vitamin B12 deficiency anemias: Secondary | ICD-10-CM | POA: Diagnosis not present

## 2023-03-14 DIAGNOSIS — G894 Chronic pain syndrome: Secondary | ICD-10-CM | POA: Diagnosis not present

## 2023-03-14 DIAGNOSIS — E663 Overweight: Secondary | ICD-10-CM | POA: Diagnosis not present

## 2023-03-14 DIAGNOSIS — M159 Polyosteoarthritis, unspecified: Secondary | ICD-10-CM | POA: Diagnosis not present

## 2023-03-14 DIAGNOSIS — E782 Mixed hyperlipidemia: Secondary | ICD-10-CM | POA: Diagnosis not present

## 2023-03-14 DIAGNOSIS — F1120 Opioid dependence, uncomplicated: Secondary | ICD-10-CM | POA: Diagnosis not present

## 2023-03-14 DIAGNOSIS — G9332 Myalgic encephalomyelitis/chronic fatigue syndrome: Secondary | ICD-10-CM | POA: Diagnosis not present

## 2023-03-14 DIAGNOSIS — F1390 Sedative, hypnotic, or anxiolytic use, unspecified, uncomplicated: Secondary | ICD-10-CM | POA: Diagnosis not present

## 2023-03-14 DIAGNOSIS — E559 Vitamin D deficiency, unspecified: Secondary | ICD-10-CM | POA: Diagnosis not present

## 2023-07-11 DIAGNOSIS — F1120 Opioid dependence, uncomplicated: Secondary | ICD-10-CM | POA: Diagnosis not present

## 2023-07-11 DIAGNOSIS — Z6826 Body mass index (BMI) 26.0-26.9, adult: Secondary | ICD-10-CM | POA: Diagnosis not present

## 2023-07-11 DIAGNOSIS — F419 Anxiety disorder, unspecified: Secondary | ICD-10-CM | POA: Diagnosis not present

## 2023-07-11 DIAGNOSIS — M159 Polyosteoarthritis, unspecified: Secondary | ICD-10-CM | POA: Diagnosis not present

## 2023-07-11 DIAGNOSIS — N390 Urinary tract infection, site not specified: Secondary | ICD-10-CM | POA: Diagnosis not present

## 2023-07-11 DIAGNOSIS — M5416 Radiculopathy, lumbar region: Secondary | ICD-10-CM | POA: Diagnosis not present

## 2023-07-11 DIAGNOSIS — E663 Overweight: Secondary | ICD-10-CM | POA: Diagnosis not present

## 2023-07-11 DIAGNOSIS — F1390 Sedative, hypnotic, or anxiolytic use, unspecified, uncomplicated: Secondary | ICD-10-CM | POA: Diagnosis not present

## 2023-07-11 DIAGNOSIS — M1991 Primary osteoarthritis, unspecified site: Secondary | ICD-10-CM | POA: Diagnosis not present

## 2023-07-11 DIAGNOSIS — G894 Chronic pain syndrome: Secondary | ICD-10-CM | POA: Diagnosis not present

## 2023-07-30 DIAGNOSIS — N390 Urinary tract infection, site not specified: Secondary | ICD-10-CM | POA: Diagnosis not present

## 2023-08-01 ENCOUNTER — Encounter: Payer: Self-pay | Admitting: Orthopaedic Surgery

## 2023-08-01 ENCOUNTER — Other Ambulatory Visit (INDEPENDENT_AMBULATORY_CARE_PROVIDER_SITE_OTHER): Payer: Medicare HMO

## 2023-08-01 ENCOUNTER — Ambulatory Visit (INDEPENDENT_AMBULATORY_CARE_PROVIDER_SITE_OTHER): Payer: Medicare HMO | Admitting: Orthopaedic Surgery

## 2023-08-01 DIAGNOSIS — G8929 Other chronic pain: Secondary | ICD-10-CM | POA: Diagnosis not present

## 2023-08-01 DIAGNOSIS — M5441 Lumbago with sciatica, right side: Secondary | ICD-10-CM | POA: Diagnosis not present

## 2023-08-01 DIAGNOSIS — M25562 Pain in left knee: Secondary | ICD-10-CM

## 2023-08-01 DIAGNOSIS — M62830 Muscle spasm of back: Secondary | ICD-10-CM | POA: Diagnosis not present

## 2023-08-01 DIAGNOSIS — M25561 Pain in right knee: Secondary | ICD-10-CM

## 2023-08-01 DIAGNOSIS — M5442 Lumbago with sciatica, left side: Secondary | ICD-10-CM

## 2023-08-01 DIAGNOSIS — M5136 Other intervertebral disc degeneration, lumbar region: Secondary | ICD-10-CM | POA: Diagnosis not present

## 2023-08-01 DIAGNOSIS — F1390 Sedative, hypnotic, or anxiolytic use, unspecified, uncomplicated: Secondary | ICD-10-CM | POA: Diagnosis not present

## 2023-08-01 DIAGNOSIS — F1120 Opioid dependence, uncomplicated: Secondary | ICD-10-CM | POA: Diagnosis not present

## 2023-08-01 DIAGNOSIS — Z6826 Body mass index (BMI) 26.0-26.9, adult: Secondary | ICD-10-CM | POA: Diagnosis not present

## 2023-08-01 DIAGNOSIS — M1991 Primary osteoarthritis, unspecified site: Secondary | ICD-10-CM | POA: Diagnosis not present

## 2023-08-01 DIAGNOSIS — M5416 Radiculopathy, lumbar region: Secondary | ICD-10-CM | POA: Diagnosis not present

## 2023-08-01 DIAGNOSIS — G894 Chronic pain syndrome: Secondary | ICD-10-CM | POA: Diagnosis not present

## 2023-08-01 DIAGNOSIS — E663 Overweight: Secondary | ICD-10-CM | POA: Diagnosis not present

## 2023-08-01 MED ORDER — METHYLPREDNISOLONE ACETATE 40 MG/ML IJ SUSP: 40.0000 mg | Freq: Once | INTRAMUSCULAR | Status: AC

## 2023-08-01 NOTE — Progress Notes (Signed)
My knees hurt but my back is the problem.  She was scheduled for knee pain but she has developed marked pain in the lower back.  She has no trauma. She has pain going to the knees posteriorly more on the left side and below to the foot.  She has no spasm or weakness.  She says this occurred suddenly about two weeks ago and is getting slowly worse.  Both knees are tender and have swelling but no giving way.  Lumbar spine is tender.  She has a back brace in place.  ROM is decreased secondary to pain.  Muscle strength and tone normal. DTRs normal, NV intact.  Toe/heel gait normal.  SLR negative.  Both knees have effusion, crepitus, NV intact.  ROM right 0 to 105, left 0 to 100, limp left.  No distal edema.  X-rays were done of the lumbar spine, reported separately.  Encounter Diagnoses  Name Primary?   Acute bilateral low back pain with bilateral sciatica Yes   Chronic pain of both knees    I will get MRI of the lumbar spine.  I am concerned about HNP.  PROCEDURE NOTE:  The patient requests injections of the right knee , verbal consent was obtained.  The right knee was prepped appropriately after time out was performed.   Sterile technique was observed and injection of 1 cc of DepoMedrol 40mg  with several cc's of plain xylocaine. Anesthesia was provided by ethyl chloride and a 20-gauge needle was used to inject the knee area. The injection was tolerated well.  A band aid dressing was applied.  The patient was advised to apply ice later today and tomorrow to the injection sight as needed.  PROCEDURE NOTE:  The patient requests injections of the left knee , verbal consent was obtained.  The left knee was prepped appropriately after time out was performed.   Sterile technique was observed and injection of 1 cc of DepoMedrol 40 mg with several cc's of plain xylocaine. Anesthesia was provided by ethyl chloride and a 20-gauge needle was used to inject the knee area. The injection was  tolerated well.  A band aid dressing was applied.  The patient was advised to apply ice later today and tomorrow to the injection sight as needed.  Return in three weeks.  Call if any problem.  Precautions discussed.  Electronically Signed Darreld Mclean, MD 8/28/202410:22 AM

## 2023-08-01 NOTE — Patient Instructions (Signed)
[facility] scheduling [phone]

## 2023-08-02 ENCOUNTER — Telehealth: Payer: Self-pay | Admitting: Orthopaedic Surgery

## 2023-08-02 NOTE — Telephone Encounter (Signed)
Returned the pt's call, lvm for her to call us back to schedule a MRI f/u appt

## 2023-08-13 ENCOUNTER — Ambulatory Visit (HOSPITAL_COMMUNITY): Payer: Medicare HMO

## 2023-08-28 ENCOUNTER — Ambulatory Visit: Payer: Medicare HMO | Admitting: Orthopaedic Surgery

## 2023-08-30 NOTE — Progress Notes (Deleted)
Name: [name] DOB: 01/07/1946 MRN: [id number]  History of Present Illness: Ms. Okabe is a 77 y.o. female who presents today as a new patient at [facility] [place]. - GU History: 1. Kidney stones.  ***hematuria related to stone? ***no recent relevant imaging per chart review on 08/30/23  She reports chief complaint of ***microscopic hematuria. - 03/15/2023: GFR 91, creatinine 0.67 - 07/30/2023: UA showed trace blood, otherwise unremarkable.  Today: She {Actions; denies-reports:120008} dysuria. She urinates*** times per day. She {Actions; denies-reports:120008} urgency. She {Actions; denies-reports:120008} the need to strain to void. She {Actions; denies-reports:120008} sensations of incomplete emptying. She {Actions; denies-reports:120008} abdominal pain. She {Actions; denies-reports:120008} flank pain. She {Actions; denies-reports:120008} fevers.  She {Actions; denies-reports:120008} prior history of gross hematuria.  She {Actions; denies-reports:120008} history of pyelonephritis.  She {Actions; denies-reports:120008} history of recent or recurrent UTI. She {Actions; denies-reports:120008} history of GU malignancy or pelvic radiation.  She {Actions; denies-reports:120008} history of autoimmune disease. She {Actions; denies-reports:120008} history of smoking (quit ***; has smoked*** ppd x*** years). She {Actions; denies-reports:120008} known occupational risks. She {Actions; denies-reports:120008} recent vigorous exercise which they think may be contributory to hematuria. She {Actions; denies-reports:120008} any recent trauma or prolonged pressure to the perineal area. She {Actions; denies-reports:120008} recent illness. She {Actions; denies-reports:120008} taking anticoagulants (***).  Fall Screening: Do you usually have a device to assist in your mobility? {yes/no:20286} ***cane / ***walker / ***wheelchair  Medications: Current Outpatient Medications   Medication Sig Dispense Refill   acetaminophen (TYLENOL) 500 MG tablet Take 500 mg by mouth every 8 (eight) hours as needed for moderate pain.     ALPRAZolam (XANAX) 1 MG tablet Take 1 mg by mouth 4 (four) times daily as needed for anxiety or sleep.      ascorbic acid (VITAMIN C) 500 MG tablet Take 500 mg by mouth daily.     HYDROcodone-acetaminophen (NORCO) 10-325 MG tablet Take 1 tablet by mouth every 6 (six) hours as needed.     loratadine (CLARITIN) 10 MG tablet Take 10 mg by mouth daily as needed for allergies.     meclizine (ANTIVERT) 25 MG tablet Take 1 tablet (25 mg total) by mouth 3 (three) times daily as needed for dizziness. 30 tablet 0   Multiple Vitamins-Minerals (HAIR/SKIN/NAILS) TABS Take 1 tablet by mouth daily.     omeprazole (PRILOSEC) 20 MG capsule Take 20 mg by mouth at bedtime as needed (for acid reflux).      pravastatin (PRAVACHOL) 40 MG tablet Take 40 mg by mouth 2 (two) times a week.     predniSONE (STERAPRED UNI-PAK 21 TAB) 5 MG (21) TBPK tablet Take 6 pills first day; 5 pills second day; 4 pills third day; 3 pills fourth day; 2 pills next day and 1 pill last day. 21 tablet 0   Prenatal Vit-Fe Fumarate-FA (PRENATAL MULTIVITAMIN) TABS tablet Take 1 tablet by mouth daily.     Specialty Vitamins Products (BIOTIN PLUS KERATIN) 10000-100 MCG-MG TABS Take 1 tablet by mouth daily.     vitamin B-12 (CYANOCOBALAMIN) 1000 MCG tablet Take 1,000 mcg by mouth 2 (two) times a week.     Current Facility-Administered Medications  Medication Dose Route Frequency Provider Last Rate Last Admin   methylPREDNISolone acetate (DEPO-MEDROL) injection 40 mg  40 mg Intra-articular Once Darreld Mclean, MD       methylPREDNISolone acetate (DEPO-MEDROL) injection 40 mg  40 mg Intra-articular Once Darreld Mclean, MD        Allergies: Allergies  Allergen Reactions   Lipitor [  Atorvastatin Calcium] Other (See Comments)    Reaction:  Leg pain     Past Medical History:  Diagnosis Date    Anxiety    Cataract    Depression    GERD (gastroesophageal reflux disease)    Hyperlipidemia    Hypothyroidism    Past Surgical History:  Procedure Laterality Date   ABDOMINAL HYSTERECTOMY     CATARACT EXTRACTION     COLONOSCOPY N/A 09/02/2015   Procedure: COLONOSCOPY;  Surgeon: Malissa Hippo, MD;  [facility];  Service: Endoscopy;  Laterality: N/A;  930   COLONOSCOPY WITH PROPOFOL N/A 11/24/2020   Procedure: COLONOSCOPY WITH PROPOFOL;  Surgeon: Malissa Hippo, MD;  [facility];  Service: Endoscopy;  Laterality: N/A;  10:00   HOT HEMOSTASIS  11/24/2020   Procedure: HOT HEMOSTASIS (ARGON PLASMA COAGULATION/BICAP);  Surgeon: Malissa Hippo, MD;  [facility];  Service: Endoscopy;;   KNEE SURGERY     POLYPECTOMY  11/24/2020   Procedure: POLYPECTOMY;  Surgeon: Malissa Hippo, MD;  [facility];  Service: Endoscopy;;   Family History  Problem Relation Age of Onset   Cancer Mother        lung   Stroke Father    Heart disease Brother    Social History   Socioeconomic History   Marital status: Married    Spouse name: Not on file   Number of children: 2   Years of education: Not on file   Highest education level: Not on file  Occupational History   Not on file  Tobacco Use   Smoking status: Never   Smokeless tobacco: Never  Vaping Use   Vaping status: Never Used  Substance and Sexual Activity   Alcohol use: No   Drug use: No   Sexual activity: Never  Other Topics Concern   Not on file  Social History Narrative   Not on file   Social Determinants of Health   Financial Resource Strain: Medium Risk (03/10/2020)   Overall Financial Resource Strain (CARDIA)    Difficulty of Paying Living Expenses: Somewhat hard  Food Insecurity: No Food Insecurity (03/10/2020)   Hunger Vital Sign    Worried About Running Out of Food in the Last Year: Never true    Ran Out of Food in the Last Year: Never true  Transportation Needs: No  Transportation Needs (03/10/2020)   PRAPARE - Administrator, Civil Service (Medical): No    Lack of Transportation (Non-Medical): No  Physical Activity: Insufficiently Active (03/10/2020)   Exercise Vital Sign    Days of Exercise per Week: 2 days    Minutes of Exercise per Session: 40 min  Stress: Stress Concern Present (03/10/2020)   [employer] of Occupational Health - Occupational Stress Questionnaire    Feeling of Stress : Very much  Social Connections: Moderately Integrated (03/10/2020)   Social Connection and Isolation Panel [NHANES]    Frequency of Communication with Friends and Family: Three times a week    Frequency of Social Gatherings with Friends and Family: Three times a week    Attends Religious Services: More than 4 times per year    Active Member of Clubs or Organizations: No    Attends Banker Meetings: Never    Marital Status: Married  Catering manager Violence: Not At Risk (03/10/2020)   Humiliation, Afraid, Rape, and Kick questionnaire    Fear of Current or Ex-Partner: No    Emotionally Abused: No  Physically Abused: No    Sexually Abused: No    SUBJECTIVE  Review of Systems Constitutional: Patient ***denies any unintentional weight loss or change in strength lntegumentary: Patient ***denies any rashes or pruritus Eyes: Patient denies ***dry eyes ENT: Patient ***denies dry mouth Cardiovascular: Patient ***denies chest pain or syncope Respiratory: Patient ***denies shortness of breath Gastrointestinal: Patient ***denies nausea, vomiting, constipation, or diarrhea Musculoskeletal: Patient ***denies muscle cramps or weakness Neurologic: Patient ***denies convulsions or seizures Psychiatric: Patient ***denies memory problems Allergic/Immunologic: Patient ***denies recent allergic reaction(s) Hematologic/Lymphatic: Patient denies bleeding tendencies Endocrine: Patient ***denies heat/cold intolerance  GU: As per HPI.  OBJECTIVE There  were no vitals filed for this visit. There is no height or weight on file to calculate BMI.  Physical Examination Constitutional: ***No obvious distress; patient is ***non-toxic appearing  Cardiovascular: ***No visible lower extremity edema.  Respiratory: The patient does ***not have audible wheezing/stridor; respirations do ***not appear labored  Gastrointestinal: Abdomen ***non-distended Musculoskeletal: ***Normal ROM of UEs  Skin: ***No obvious rashes/open sores  Neurologic: CN 2-12 grossly ***intact Psychiatric: Answered questions ***appropriately with ***normal affect  Hematologic/Lymphatic/Immunologic: ***No obvious bruises or sites of spontaneous bleeding  UA: ***negative / *** WBC/hpf, *** RBC/hpf, bacteria (***) PVR: *** ml  ASSESSMENT No diagnosis found.  For asymptomatic microscopic hematuria we discussed possible etiologies including but not limited to: vigorous exercise, sexual activity, stone, trauma, blood thinner use, urinary tract infection, urethral irritation secondary to ***vaginal atrophy, chronic kidney disease, glomerulonephropathy, ***BPH, ***radiation cystitis, malignancy. ***We discussed pt's smoking as a risk factor for GU cancer and encouraged ***continued smoking cessation.***  We reviewed the AUA 2020 [facility] guideline and risk stratification for this patient. Based on individual risk factors, pt was advised that the recommended workup includes ***repeat UA in 6 months ***RUS ***CT urogram ***cystoscopy. Pt decided to pursue this work-up and follow-up afterward to discuss the results and formulate a treatment plan based on the findings. All questions were answered.  *** AUA 2020 [facility] guideline     PLAN Advised the following: ***RUS ***CT ordered. 2. ***No follow-ups on file.  No orders of the defined types were placed in this encounter.   It has been explained that the patient is to follow regularly with their PCP in addition to all other providers  involved in their care and to follow instructions provided by these respective offices. Patient advised to contact urology clinic if any urologic-pertaining questions, concerns, new symptoms or problems arise in the interim period.  There are no Patient Instructions on file for this visit.  Electronically signed by:  Donnita Falls, MSN, FNP-C, CUNP 08/30/2023 1:07 PM

## 2023-09-04 NOTE — Progress Notes (Signed)
Name: [name] DOB: 12-23-1945 MRN: [id number]  History of Present Illness: Ms. [name] is a 77 y.o. female who presents today as a new patient at [facility] [place]. - GU History: 1. Kidney stones.  She reports chief complaint of microscopic hematuria. - 03/15/2023: GFR 91, creatinine 0.67 - 07/30/2023: UA showed trace blood, otherwise unremarkable.  Today: She reports that at baseline she nocturia x1 and occasional stress urinary incontinence. Denies urinary urgency, frequency, dysuria, gross hematuria, hesitancy, straining to void, or sensations of incomplete emptying.  She reports history of intermittent back pain which has improved previously with use of a muscle relaxer and back brace. She denies flank pain or abdominal pain.  She denies prior history of gross hematuria.  She denies history of recent or recurrent UTI. She denies history of GU malignancy or pelvic radiation.  She denies history of autoimmune disease. She denies history of smoking. She denies any recent trauma or prolonged pressure to the perineal area. She denies taking anticoagulants.   Fall Screening: Do you usually have a device to assist in your mobility? No   Medications: Current Outpatient Medications  Medication Sig Dispense Refill   acetaminophen (TYLENOL) 500 MG tablet Take 500 mg by mouth every 8 (eight) hours as needed for moderate pain.     ALPRAZolam (XANAX) 1 MG tablet Take 1 mg by mouth 4 (four) times daily as needed for anxiety or sleep.      ascorbic acid (VITAMIN C) 500 MG tablet Take 500 mg by mouth daily.     HYDROcodone-acetaminophen (NORCO) 10-325 MG tablet Take 1 tablet by mouth every 6 (six) hours as needed.     loratadine (CLARITIN) 10 MG tablet Take 10 mg by mouth daily as needed for allergies.     meclizine (ANTIVERT) 25 MG tablet Take 1 tablet (25 mg total) by mouth 3 (three) times daily as needed for dizziness. 30 tablet 0   omeprazole (PRILOSEC) 20 MG  capsule Take 20 mg by mouth at bedtime as needed (for acid reflux).      tiZANidine (ZANAFLEX) 4 MG tablet Take 4 mg by mouth every 6 (six) hours.     vitamin B-12 (CYANOCOBALAMIN) 1000 MCG tablet Take 1,000 mcg by mouth 2 (two) times a week.     Multiple Vitamins-Minerals (HAIR/SKIN/NAILS) TABS Take 1 tablet by mouth daily. (Patient not taking: Reported on 09/14/2023)     pravastatin (PRAVACHOL) 40 MG tablet Take 40 mg by mouth 2 (two) times a week. (Patient not taking: Reported on 09/14/2023)     predniSONE (STERAPRED UNI-PAK 21 TAB) 5 MG (21) TBPK tablet Take 6 pills first day; 5 pills second day; 4 pills third day; 3 pills fourth day; 2 pills next day and 1 pill last day. (Patient not taking: Reported on 09/14/2023) 21 tablet 0   Prenatal Vit-Fe Fumarate-FA (PRENATAL MULTIVITAMIN) TABS tablet Take 1 tablet by mouth daily. (Patient not taking: Reported on 09/14/2023)     Specialty Vitamins Products (BIOTIN PLUS KERATIN) 10000-100 MCG-MG TABS Take 1 tablet by mouth daily. (Patient not taking: Reported on 09/14/2023)     Current Facility-Administered Medications  Medication Dose Route Frequency Provider Last Rate Last Admin   methylPREDNISolone acetate (DEPO-MEDROL) injection 40 mg  40 mg Intra-articular Once Darreld Mclean, MD       methylPREDNISolone acetate (DEPO-MEDROL) injection 40 mg  40 mg Intra-articular Once Darreld Mclean, MD        Allergies: Allergies  Allergen Reactions   Lipitor [Atorvastatin Calcium]  Other (See Comments)    Reaction:  Leg pain     Past Medical History:  Diagnosis Date   Anxiety    Cataract    Depression    GERD (gastroesophageal reflux disease)    Hyperlipidemia    Hypothyroidism    Past Surgical History:  Procedure Laterality Date   ABDOMINAL HYSTERECTOMY     CATARACT EXTRACTION     COLONOSCOPY N/A 09/02/2015   Procedure: COLONOSCOPY;  Surgeon: Malissa Hippo, MD;  [facility];  Service: Endoscopy;  Laterality: N/A;  930   COLONOSCOPY  WITH PROPOFOL N/A 11/24/2020   Procedure: COLONOSCOPY WITH PROPOFOL;  Surgeon: Malissa Hippo, MD;  [facility];  Service: Endoscopy;  Laterality: N/A;  10:00   HOT HEMOSTASIS  11/24/2020   Procedure: HOT HEMOSTASIS (ARGON PLASMA COAGULATION/BICAP);  Surgeon: Malissa Hippo, MD;  [facility];  Service: Endoscopy;;   KNEE SURGERY     POLYPECTOMY  11/24/2020   Procedure: POLYPECTOMY;  Surgeon: Malissa Hippo, MD;  [facility];  Service: Endoscopy;;   Family History  Problem Relation Age of Onset   Cancer Mother        lung   Stroke Father    Heart disease Brother    Social History   Socioeconomic History   Marital status: Married    Spouse name: Not on file   Number of children: 2   Years of education: Not on file   Highest education level: Not on file  Occupational History   Not on file  Tobacco Use   Smoking status: Never   Smokeless tobacco: Never  Vaping Use   Vaping status: Never Used  Substance and Sexual Activity   Alcohol use: No   Drug use: No   Sexual activity: Never  Other Topics Concern   Not on file  Social History Narrative   Not on file   Social Determinants of Health   Financial Resource Strain: Medium Risk (03/10/2020)   Overall Financial Resource Strain (CARDIA)    Difficulty of Paying Living Expenses: Somewhat hard  Food Insecurity: No Food Insecurity (03/10/2020)   Hunger Vital Sign    Worried About Running Out of Food in the Last Year: Never true    Ran Out of Food in the Last Year: Never true  Transportation Needs: No Transportation Needs (03/10/2020)   PRAPARE - Administrator, Civil Service (Medical): No    Lack of Transportation (Non-Medical): No  Physical Activity: Insufficiently Active (03/10/2020)   Exercise Vital Sign    Days of Exercise per Week: 2 days    Minutes of Exercise per Session: 40 min  Stress: Stress Concern Present (03/10/2020)   [employer] of Occupational Health -  Occupational Stress Questionnaire    Feeling of Stress : Very much  Social Connections: Moderately Integrated (03/10/2020)   Social Connection and Isolation Panel [NHANES]    Frequency of Communication with Friends and Family: Three times a week    Frequency of Social Gatherings with Friends and Family: Three times a week    Attends Religious Services: More than 4 times per year    Active Member of Clubs or Organizations: No    Attends Banker Meetings: Never    Marital Status: Married  Catering manager Violence: Not At Risk (03/10/2020)   Humiliation, Afraid, Rape, and Kick questionnaire    Fear of Current or Ex-Partner: No    Emotionally Abused: No  Physically Abused: No    Sexually Abused: No    SUBJECTIVE  Review of Systems Constitutional: Patient denies any unintentional weight loss or change in strength lntegumentary: Patient denies any rashes or pruritus Cardiovascular: Patient denies chest pain or syncope Respiratory: Patient denies shortness of breath Gastrointestinal: Patient denies nausea, vomiting, constipation, or diarrhea Musculoskeletal: Patient denies muscle cramps or weakness Neurologic: Patient denies convulsions or seizures Psychiatric: Patient denies memory problems Allergic/Immunologic: Patient denies recent allergic reaction(s) Hematologic/Lymphatic: Patient denies bleeding tendencies Endocrine: Patient denies heat/cold intolerance  GU: As per HPI.  OBJECTIVE Vitals:   09/14/23 1010  BP: (!) 160/69  Pulse: 76  Temp: 98.2 F (36.8 C)   There is no height or weight on file to calculate BMI.  Physical Examination Constitutional: No obvious distress; patient is non-toxic appearing  Cardiovascular: No visible lower extremity edema.  Respiratory: The patient does not have audible wheezing/stridor; respirations do not appear labored  Gastrointestinal: Abdomen non-distended Musculoskeletal: Normal ROM of UEs  Skin: No obvious rashes/open  sores  Neurologic: CN 2-12 grossly intact Psychiatric: Answered questions appropriately with normal affect  Hematologic/Lymphatic/Immunologic: No obvious bruises or sites of spontaneous bleeding  UA: 3-10 RBC/hpf with no evidence of UTI   ASSESSMENT Microscopic hematuria - Plan: BLADDER SCAN AMB NON-IMAGING, Urinalysis, Routine w reflex microscopic, US RENAL  Kidney stones - Plan: BLADDER SCAN AMB NON-IMAGING, Urinalysis, Routine w reflex microscopic, US RENAL  For asymptomatic microscopic hematuria we discussed possible etiologies including but not limited to: vigorous exercise, sexual activity, stone, trauma, blood thinner use, urinary tract infection, urethral irritation secondary to vaginal atrophy, chronic kidney disease, glomerulonephropathy, malignancy.   We reviewed the AUA 2020 [facility] guideline and risk stratification for this patient. Based on individual risk factors, pt was advised that the recommended workup includes CT urogram and cystoscopy. She declined CT due to cost concerns; elected to proceed with RUS as alternative. Will plan to follow up afterward to discuss the results and formulate a treatment plan based on the findings. All questions were answered.   PLAN Advised the following: RUS. 2. Return for 1st available cystoscopy with any urology MD.  Orders Placed This Encounter  Procedures   US RENAL    Standing Status:   Future    Standing Expiration Date:   09/13/2024    Order Specific Question:   Reason for Exam (SYMPTOM  OR DIAGNOSIS REQUIRED)    Answer:   kidney stone known or suspected    Order Specific Question:   Preferred imaging location?    Answer:   [facility]   Urinalysis, Routine w reflex microscopic   BLADDER SCAN AMB NON-IMAGING    It has been explained that the patient is to follow regularly with their PCP in addition to all other providers involved in their care and to follow instructions provided by these respective offices. Patient advised  to contact urology clinic if any urologic-pertaining questions, concerns, new symptoms or problems arise in the interim period.  There are no Patient Instructions on file for this visit.  Electronically signed by:  Donnita Falls, MSN, FNP-C, CUNP 09/14/2023 10:34 AM

## 2023-09-05 ENCOUNTER — Ambulatory Visit: Payer: Medicare HMO | Admitting: Urology

## 2023-09-05 DIAGNOSIS — R319 Hematuria, unspecified: Secondary | ICD-10-CM

## 2023-09-06 ENCOUNTER — Other Ambulatory Visit (HOSPITAL_COMMUNITY): Payer: Self-pay | Admitting: Internal Medicine

## 2023-09-06 DIAGNOSIS — Z1231 Encounter for screening mammogram for malignant neoplasm of breast: Secondary | ICD-10-CM

## 2023-09-13 ENCOUNTER — Ambulatory Visit (HOSPITAL_COMMUNITY)
Admission: RE | Admit: 2023-09-13 | Discharge: 2023-09-13 | Disposition: A | Discharge status: Home / Self Care | Payer: Medicare HMO | Source: Ambulatory Visit | Attending: Internal Medicine | Admitting: Internal Medicine

## 2023-09-13 DIAGNOSIS — Z1231 Encounter for screening mammogram for malignant neoplasm of breast: Secondary | ICD-10-CM | POA: Diagnosis not present

## 2023-09-14 ENCOUNTER — Ambulatory Visit: Payer: Medicare HMO | Admitting: Urology

## 2023-09-14 ENCOUNTER — Encounter: Payer: Self-pay | Admitting: Urology

## 2023-09-14 VITALS — BP 160/69 | HR 76 | Temp 98.2°F

## 2023-09-14 DIAGNOSIS — R3129 Other microscopic hematuria: Secondary | ICD-10-CM | POA: Diagnosis not present

## 2023-09-14 DIAGNOSIS — N200 Calculus of kidney: Secondary | ICD-10-CM | POA: Diagnosis not present

## 2023-09-14 LAB — MICROSCOPIC EXAMINATION: Bacteria, UA: NONE SEEN

## 2023-09-14 LAB — URINALYSIS, ROUTINE W REFLEX MICROSCOPIC
Bilirubin, UA: NEGATIVE
Glucose, UA: NEGATIVE
Ketones, UA: NEGATIVE
Nitrite, UA: NEGATIVE
Protein,UA: NEGATIVE
Specific Gravity, UA: 1.015 (ref 1.005–1.030)
Urobilinogen, Ur: 0.2 mg/dL (ref 0.2–1.0)
pH, UA: 7 (ref 5.0–7.5)

## 2023-09-14 LAB — BLADDER SCAN AMB NON-IMAGING: Scan Result: 0

## 2023-09-26 DIAGNOSIS — M159 Polyosteoarthritis, unspecified: Secondary | ICD-10-CM | POA: Diagnosis not present

## 2023-09-26 DIAGNOSIS — F419 Anxiety disorder, unspecified: Secondary | ICD-10-CM | POA: Diagnosis not present

## 2023-09-26 DIAGNOSIS — M5416 Radiculopathy, lumbar region: Secondary | ICD-10-CM | POA: Diagnosis not present

## 2023-09-26 DIAGNOSIS — E663 Overweight: Secondary | ICD-10-CM | POA: Diagnosis not present

## 2023-09-26 DIAGNOSIS — M62830 Muscle spasm of back: Secondary | ICD-10-CM | POA: Diagnosis not present

## 2023-09-26 DIAGNOSIS — M5134 Other intervertebral disc degeneration, thoracic region: Secondary | ICD-10-CM | POA: Diagnosis not present

## 2023-09-26 DIAGNOSIS — G894 Chronic pain syndrome: Secondary | ICD-10-CM | POA: Diagnosis not present

## 2023-09-26 DIAGNOSIS — F1120 Opioid dependence, uncomplicated: Secondary | ICD-10-CM | POA: Diagnosis not present

## 2023-09-26 DIAGNOSIS — M1991 Primary osteoarthritis, unspecified site: Secondary | ICD-10-CM | POA: Diagnosis not present

## 2023-09-28 ENCOUNTER — Ambulatory Visit (HOSPITAL_COMMUNITY)
Admission: RE | Admit: 2023-09-28 | Discharge: 2023-09-28 | Disposition: A | Discharge status: Home / Self Care | Payer: Medicare HMO | Source: Ambulatory Visit | Attending: Urology | Admitting: Urology

## 2023-09-28 DIAGNOSIS — N200 Calculus of kidney: Secondary | ICD-10-CM | POA: Diagnosis not present

## 2023-09-28 DIAGNOSIS — R3129 Other microscopic hematuria: Secondary | ICD-10-CM | POA: Insufficient documentation

## 2023-10-22 NOTE — Progress Notes (Signed)
Letter sent.

## 2023-10-29 ENCOUNTER — Encounter (INDEPENDENT_AMBULATORY_CARE_PROVIDER_SITE_OTHER): Payer: Self-pay | Admitting: *Deleted

## 2023-11-29 DIAGNOSIS — F419 Anxiety disorder, unspecified: Secondary | ICD-10-CM | POA: Diagnosis not present

## 2023-11-29 DIAGNOSIS — M1991 Primary osteoarthritis, unspecified site: Secondary | ICD-10-CM | POA: Diagnosis not present

## 2023-11-29 DIAGNOSIS — F1120 Opioid dependence, uncomplicated: Secondary | ICD-10-CM | POA: Diagnosis not present

## 2023-11-29 DIAGNOSIS — Z6824 Body mass index (BMI) 24.0-24.9, adult: Secondary | ICD-10-CM | POA: Diagnosis not present

## 2023-11-29 DIAGNOSIS — M5134 Other intervertebral disc degeneration, thoracic region: Secondary | ICD-10-CM | POA: Diagnosis not present

## 2023-11-29 DIAGNOSIS — G894 Chronic pain syndrome: Secondary | ICD-10-CM | POA: Diagnosis not present

## 2023-11-29 DIAGNOSIS — M159 Polyosteoarthritis, unspecified: Secondary | ICD-10-CM | POA: Diagnosis not present

## 2023-11-29 DIAGNOSIS — F1390 Sedative, hypnotic, or anxiolytic use, unspecified, uncomplicated: Secondary | ICD-10-CM | POA: Diagnosis not present

## 2023-12-06 ENCOUNTER — Telehealth: Payer: Self-pay

## 2023-12-06 NOTE — Telephone Encounter (Signed)
 Called Pt and left message that NP wants to do f/u in three months appt set for 03/03

## 2023-12-06 NOTE — Telephone Encounter (Signed)
 Patient is doing well.  Did not want to do cysto at this time.   Wants to know if she needs to be contacted or rescheduled if renal ultrasound showed something wrong?  Please advise.

## 2023-12-06 NOTE — Telephone Encounter (Signed)
**Note De-identified  [place] Obfuscation** Please advise 

## 2023-12-10 ENCOUNTER — Other Ambulatory Visit: Payer: Medicare HMO | Admitting: Urology

## 2023-12-19 ENCOUNTER — Encounter: Payer: Self-pay | Admitting: Orthopaedic Surgery

## 2023-12-19 ENCOUNTER — Ambulatory Visit (INDEPENDENT_AMBULATORY_CARE_PROVIDER_SITE_OTHER): Payer: Medicare HMO | Admitting: Orthopaedic Surgery

## 2023-12-19 DIAGNOSIS — G8929 Other chronic pain: Secondary | ICD-10-CM

## 2023-12-19 DIAGNOSIS — M25562 Pain in left knee: Secondary | ICD-10-CM | POA: Diagnosis not present

## 2023-12-19 DIAGNOSIS — M25561 Pain in right knee: Secondary | ICD-10-CM

## 2023-12-19 MED ORDER — IBUPROFEN 600 MG PO TABS: 600.0000 mg | ORAL_TABLET | Freq: Four times a day (QID) | ORAL | 5 refills | Status: AC | PRN

## 2023-12-19 MED ORDER — METHYLPREDNISOLONE ACETATE 40 MG/ML IJ SUSP
40.0000 mg | Freq: Once | INTRAMUSCULAR | Status: AC
Start: 2023-12-19 — End: 2023-12-19
  Administered 2023-12-19: 40 mg via INTRA_ARTICULAR

## 2023-12-19 NOTE — Progress Notes (Signed)
 PROCEDURE NOTE:  The patient requests injections of the right knee , verbal consent was obtained.  The right knee was prepped appropriately after time out was performed.   Sterile technique was observed and injection of 1 cc of DepoMedrol 40mg  with several cc's of plain xylocaine . Anesthesia was provided by ethyl chloride and a 20-gauge needle was used to inject the knee area. The injection was tolerated well.  A band aid dressing was applied.  The patient was advised to apply ice later today and tomorrow to the injection sight as needed.  PROCEDURE NOTE:  The patient requests injections of the left knee , verbal consent was obtained.  The left knee was prepped appropriately after time out was performed.   Sterile technique was observed and injection of 1 cc of DepoMedrol 40 mg with several cc's of plain xylocaine . Anesthesia was provided by ethyl chloride and a 20-gauge needle was used to inject the knee area. The injection was tolerated well.  A band aid dressing was applied.  The patient was advised to apply ice later today and tomorrow to the injection sight as needed.  Encounter Diagnosis  Name Primary?   Chronic pain of both knees Yes   Return prn.  Call if any problem.  Precautions discussed.  Electronically Signed Pleasant Brilliant, MD 1/15/20251:47 PM

## 2024-02-04 ENCOUNTER — Ambulatory Visit: Payer: Medicare HMO | Admitting: Urology

## 2024-03-03 DIAGNOSIS — E782 Mixed hyperlipidemia: Secondary | ICD-10-CM | POA: Diagnosis not present

## 2024-03-03 DIAGNOSIS — K219 Gastro-esophageal reflux disease without esophagitis: Secondary | ICD-10-CM | POA: Diagnosis not present

## 2024-03-03 DIAGNOSIS — F329 Major depressive disorder, single episode, unspecified: Secondary | ICD-10-CM | POA: Diagnosis not present

## 2024-03-03 DIAGNOSIS — F419 Anxiety disorder, unspecified: Secondary | ICD-10-CM | POA: Diagnosis not present

## 2024-03-13 ENCOUNTER — Other Ambulatory Visit (HOSPITAL_COMMUNITY): Payer: Self-pay | Admitting: Internal Medicine

## 2024-03-13 ENCOUNTER — Ambulatory Visit (HOSPITAL_COMMUNITY)
Admission: RE | Admit: 2024-03-13 | Discharge: 2024-03-13 | Disposition: A | Discharge status: Home / Self Care | Source: Ambulatory Visit | Attending: Internal Medicine | Admitting: Internal Medicine

## 2024-03-13 DIAGNOSIS — F419 Anxiety disorder, unspecified: Secondary | ICD-10-CM | POA: Diagnosis not present

## 2024-03-13 DIAGNOSIS — M546 Pain in thoracic spine: Secondary | ICD-10-CM

## 2024-03-13 DIAGNOSIS — E2839 Other primary ovarian failure: Secondary | ICD-10-CM | POA: Diagnosis not present

## 2024-03-13 DIAGNOSIS — M47814 Spondylosis without myelopathy or radiculopathy, thoracic region: Secondary | ICD-10-CM | POA: Diagnosis not present

## 2024-03-13 DIAGNOSIS — M545 Low back pain, unspecified: Secondary | ICD-10-CM

## 2024-03-13 DIAGNOSIS — G894 Chronic pain syndrome: Secondary | ICD-10-CM | POA: Diagnosis not present

## 2024-03-13 DIAGNOSIS — M549 Dorsalgia, unspecified: Secondary | ICD-10-CM | POA: Diagnosis not present

## 2024-03-13 DIAGNOSIS — M47816 Spondylosis without myelopathy or radiculopathy, lumbar region: Secondary | ICD-10-CM | POA: Diagnosis not present

## 2024-03-13 DIAGNOSIS — M5134 Other intervertebral disc degeneration, thoracic region: Secondary | ICD-10-CM | POA: Diagnosis not present

## 2024-03-13 DIAGNOSIS — F1120 Opioid dependence, uncomplicated: Secondary | ICD-10-CM | POA: Diagnosis not present

## 2024-03-13 DIAGNOSIS — M1991 Primary osteoarthritis, unspecified site: Secondary | ICD-10-CM | POA: Diagnosis not present

## 2024-03-13 DIAGNOSIS — M159 Polyosteoarthritis, unspecified: Secondary | ICD-10-CM | POA: Diagnosis not present

## 2024-03-13 DIAGNOSIS — M419 Scoliosis, unspecified: Secondary | ICD-10-CM | POA: Diagnosis not present

## 2024-03-13 DIAGNOSIS — G8929 Other chronic pain: Secondary | ICD-10-CM | POA: Diagnosis not present

## 2024-03-14 ENCOUNTER — Other Ambulatory Visit (HOSPITAL_COMMUNITY): Payer: Self-pay | Admitting: Internal Medicine

## 2024-03-14 DIAGNOSIS — E2839 Other primary ovarian failure: Secondary | ICD-10-CM

## 2024-03-31 ENCOUNTER — Ambulatory Visit (HOSPITAL_COMMUNITY)
Admission: RE | Admit: 2024-03-31 | Discharge: 2024-03-31 | Disposition: A | Discharge status: Home / Self Care | Source: Ambulatory Visit | Attending: Internal Medicine | Admitting: Internal Medicine

## 2024-03-31 ENCOUNTER — Other Ambulatory Visit (HOSPITAL_COMMUNITY): Payer: Self-pay | Admitting: Internal Medicine

## 2024-03-31 DIAGNOSIS — R52 Pain, unspecified: Secondary | ICD-10-CM

## 2024-03-31 DIAGNOSIS — S00419A Abrasion of unspecified ear, initial encounter: Secondary | ICD-10-CM | POA: Diagnosis not present

## 2024-03-31 DIAGNOSIS — M159 Polyosteoarthritis, unspecified: Secondary | ICD-10-CM | POA: Diagnosis not present

## 2024-03-31 DIAGNOSIS — M1712 Unilateral primary osteoarthritis, left knee: Secondary | ICD-10-CM | POA: Diagnosis not present

## 2024-03-31 DIAGNOSIS — M546 Pain in thoracic spine: Secondary | ICD-10-CM | POA: Diagnosis not present

## 2024-03-31 DIAGNOSIS — E538 Deficiency of other specified B group vitamins: Secondary | ICD-10-CM | POA: Diagnosis not present

## 2024-03-31 DIAGNOSIS — M545 Low back pain, unspecified: Secondary | ICD-10-CM | POA: Diagnosis not present

## 2024-03-31 DIAGNOSIS — Z6824 Body mass index (BMI) 24.0-24.9, adult: Secondary | ICD-10-CM | POA: Diagnosis not present

## 2024-03-31 DIAGNOSIS — M25462 Effusion, left knee: Secondary | ICD-10-CM | POA: Diagnosis not present

## 2024-03-31 DIAGNOSIS — M25561 Pain in right knee: Secondary | ICD-10-CM | POA: Diagnosis not present

## 2024-03-31 DIAGNOSIS — F1120 Opioid dependence, uncomplicated: Secondary | ICD-10-CM | POA: Diagnosis not present

## 2024-03-31 DIAGNOSIS — E063 Autoimmune thyroiditis: Secondary | ICD-10-CM | POA: Diagnosis not present

## 2024-03-31 DIAGNOSIS — M1711 Unilateral primary osteoarthritis, right knee: Secondary | ICD-10-CM | POA: Diagnosis not present

## 2024-03-31 DIAGNOSIS — E559 Vitamin D deficiency, unspecified: Secondary | ICD-10-CM | POA: Diagnosis not present

## 2024-03-31 DIAGNOSIS — Z0001 Encounter for general adult medical examination with abnormal findings: Secondary | ICD-10-CM | POA: Diagnosis not present

## 2024-03-31 DIAGNOSIS — M25562 Pain in left knee: Secondary | ICD-10-CM | POA: Diagnosis not present

## 2024-04-01 ENCOUNTER — Other Ambulatory Visit (HOSPITAL_COMMUNITY): Payer: Self-pay | Admitting: Internal Medicine

## 2024-04-01 DIAGNOSIS — M79606 Pain in leg, unspecified: Secondary | ICD-10-CM

## 2024-04-02 DIAGNOSIS — E782 Mixed hyperlipidemia: Secondary | ICD-10-CM | POA: Diagnosis not present

## 2024-04-02 DIAGNOSIS — H9221 Otorrhagia, right ear: Secondary | ICD-10-CM | POA: Diagnosis not present

## 2024-04-02 DIAGNOSIS — F419 Anxiety disorder, unspecified: Secondary | ICD-10-CM | POA: Diagnosis not present

## 2024-04-02 DIAGNOSIS — F1390 Sedative, hypnotic, or anxiolytic use, unspecified, uncomplicated: Secondary | ICD-10-CM | POA: Diagnosis not present

## 2024-04-18 ENCOUNTER — Ambulatory Visit (HOSPITAL_COMMUNITY)
Admission: RE | Admit: 2024-04-18 | Discharge: 2024-04-18 | Disposition: A | Discharge status: Home / Self Care | Source: Ambulatory Visit | Attending: Internal Medicine | Admitting: Internal Medicine

## 2024-04-18 DIAGNOSIS — M79605 Pain in left leg: Secondary | ICD-10-CM | POA: Diagnosis not present

## 2024-04-18 DIAGNOSIS — M79604 Pain in right leg: Secondary | ICD-10-CM | POA: Diagnosis not present

## 2024-04-18 DIAGNOSIS — M79606 Pain in leg, unspecified: Secondary | ICD-10-CM | POA: Diagnosis not present

## 2024-04-29 DIAGNOSIS — Z0001 Encounter for general adult medical examination with abnormal findings: Secondary | ICD-10-CM | POA: Diagnosis not present

## 2024-04-29 DIAGNOSIS — E063 Autoimmune thyroiditis: Secondary | ICD-10-CM | POA: Diagnosis not present

## 2024-04-29 DIAGNOSIS — M5416 Radiculopathy, lumbar region: Secondary | ICD-10-CM | POA: Diagnosis not present

## 2024-04-29 DIAGNOSIS — Z6824 Body mass index (BMI) 24.0-24.9, adult: Secondary | ICD-10-CM | POA: Diagnosis not present

## 2024-04-29 DIAGNOSIS — G894 Chronic pain syndrome: Secondary | ICD-10-CM | POA: Diagnosis not present

## 2024-04-29 DIAGNOSIS — Z1331 Encounter for screening for depression: Secondary | ICD-10-CM | POA: Diagnosis not present

## 2024-04-29 DIAGNOSIS — M5134 Other intervertebral disc degeneration, thoracic region: Secondary | ICD-10-CM | POA: Diagnosis not present

## 2024-04-29 DIAGNOSIS — F1120 Opioid dependence, uncomplicated: Secondary | ICD-10-CM | POA: Diagnosis not present

## 2024-04-29 DIAGNOSIS — M1991 Primary osteoarthritis, unspecified site: Secondary | ICD-10-CM | POA: Diagnosis not present

## 2024-05-14 ENCOUNTER — Ambulatory Visit (INDEPENDENT_AMBULATORY_CARE_PROVIDER_SITE_OTHER): Admitting: Orthopaedic Surgery

## 2024-05-14 ENCOUNTER — Encounter: Payer: Self-pay | Admitting: Orthopaedic Surgery

## 2024-05-14 DIAGNOSIS — M25562 Pain in left knee: Secondary | ICD-10-CM | POA: Diagnosis not present

## 2024-05-14 DIAGNOSIS — G8929 Other chronic pain: Secondary | ICD-10-CM

## 2024-05-14 DIAGNOSIS — M25561 Pain in right knee: Secondary | ICD-10-CM | POA: Diagnosis not present

## 2024-05-14 MED ORDER — METHYLPREDNISOLONE ACETATE 40 MG/ML IJ SUSP
40.0000 mg | Freq: Once | INTRAMUSCULAR | Status: AC
  Administered 2024-05-14: 40 mg via INTRA_ARTICULAR

## 2024-05-14 NOTE — Progress Notes (Signed)
 PROCEDURE NOTE:  The patient request injection, verbal consent was obtained.  The bilateral knee was prepped appropriately after time out was performed.   Sterile technique was observed and anesthesia was provided by ethyl chloride and a 20-gauge needle was used to inject the knee area for both knees.  A 16-gauge needle was then used to aspirate the knee.  Color of fluid aspirated was straw  Total cc's aspirated was 15 left, 3 right.    Injection of 1 cc of DepoMedrol 40 with several cc's of plain xylocaine  was then performed to each knee  A band aid dressing was applied.  The patient was advised to apply ice later today and tomorrow to the injection sight as needed.  Encounter Diagnosis  Name Primary?   Chronic pain of both knees Yes   Return prn.  Call if any problem.  Precautions discussed.  Electronically Signed Pleasant Brilliant, MD 6/11/20251:54 PM

## 2024-05-14 NOTE — Addendum Note (Signed)
 Addended by: Maryland Snow T on: 05/14/2024 03:29 PM   Modules accepted: Orders

## 2024-07-01 DIAGNOSIS — R609 Edema, unspecified: Secondary | ICD-10-CM | POA: Diagnosis not present

## 2024-07-01 DIAGNOSIS — M5134 Other intervertebral disc degeneration, thoracic region: Secondary | ICD-10-CM | POA: Diagnosis not present

## 2024-07-01 DIAGNOSIS — I872 Venous insufficiency (chronic) (peripheral): Secondary | ICD-10-CM | POA: Diagnosis not present

## 2024-07-01 DIAGNOSIS — M159 Polyosteoarthritis, unspecified: Secondary | ICD-10-CM | POA: Diagnosis not present

## 2024-07-01 DIAGNOSIS — Z6825 Body mass index (BMI) 25.0-25.9, adult: Secondary | ICD-10-CM | POA: Diagnosis not present

## 2024-07-01 DIAGNOSIS — M1991 Primary osteoarthritis, unspecified site: Secondary | ICD-10-CM | POA: Diagnosis not present

## 2024-07-01 DIAGNOSIS — E663 Overweight: Secondary | ICD-10-CM | POA: Diagnosis not present

## 2024-07-01 DIAGNOSIS — F1120 Opioid dependence, uncomplicated: Secondary | ICD-10-CM | POA: Diagnosis not present

## 2024-07-10 DIAGNOSIS — Z6825 Body mass index (BMI) 25.0-25.9, adult: Secondary | ICD-10-CM | POA: Diagnosis not present

## 2024-07-10 DIAGNOSIS — M5136 Other intervertebral disc degeneration, lumbar region with discogenic back pain only: Secondary | ICD-10-CM | POA: Diagnosis not present

## 2024-07-10 DIAGNOSIS — M5416 Radiculopathy, lumbar region: Secondary | ICD-10-CM | POA: Diagnosis not present

## 2024-07-10 DIAGNOSIS — M5134 Other intervertebral disc degeneration, thoracic region: Secondary | ICD-10-CM | POA: Diagnosis not present

## 2024-07-10 DIAGNOSIS — M545 Low back pain, unspecified: Secondary | ICD-10-CM | POA: Diagnosis not present

## 2024-07-14 ENCOUNTER — Other Ambulatory Visit (HOSPITAL_COMMUNITY): Payer: Self-pay | Admitting: Internal Medicine

## 2024-07-14 DIAGNOSIS — Z1231 Encounter for screening mammogram for malignant neoplasm of breast: Secondary | ICD-10-CM

## 2024-08-05 ENCOUNTER — Other Ambulatory Visit (HOSPITAL_COMMUNITY)

## 2024-08-07 DIAGNOSIS — M25562 Pain in left knee: Secondary | ICD-10-CM | POA: Diagnosis not present

## 2024-08-07 DIAGNOSIS — M5136 Other intervertebral disc degeneration, lumbar region with discogenic back pain only: Secondary | ICD-10-CM | POA: Diagnosis not present

## 2024-08-07 DIAGNOSIS — M5134 Other intervertebral disc degeneration, thoracic region: Secondary | ICD-10-CM | POA: Diagnosis not present

## 2024-08-07 DIAGNOSIS — M546 Pain in thoracic spine: Secondary | ICD-10-CM | POA: Diagnosis not present

## 2024-08-07 DIAGNOSIS — M545 Low back pain, unspecified: Secondary | ICD-10-CM | POA: Diagnosis not present

## 2024-08-07 DIAGNOSIS — F419 Anxiety disorder, unspecified: Secondary | ICD-10-CM | POA: Diagnosis not present

## 2024-08-07 DIAGNOSIS — M159 Polyosteoarthritis, unspecified: Secondary | ICD-10-CM | POA: Diagnosis not present

## 2024-08-07 DIAGNOSIS — G894 Chronic pain syndrome: Secondary | ICD-10-CM | POA: Diagnosis not present

## 2024-08-07 DIAGNOSIS — M25561 Pain in right knee: Secondary | ICD-10-CM | POA: Diagnosis not present

## 2024-09-08 DIAGNOSIS — M25562 Pain in left knee: Secondary | ICD-10-CM | POA: Diagnosis not present

## 2024-09-08 DIAGNOSIS — F419 Anxiety disorder, unspecified: Secondary | ICD-10-CM | POA: Diagnosis not present

## 2024-09-08 DIAGNOSIS — E782 Mixed hyperlipidemia: Secondary | ICD-10-CM | POA: Diagnosis not present

## 2024-09-08 DIAGNOSIS — Z87442 Personal history of urinary calculi: Secondary | ICD-10-CM | POA: Diagnosis not present

## 2024-09-08 DIAGNOSIS — M25561 Pain in right knee: Secondary | ICD-10-CM | POA: Diagnosis not present

## 2024-09-08 DIAGNOSIS — R609 Edema, unspecified: Secondary | ICD-10-CM | POA: Diagnosis not present

## 2024-09-08 DIAGNOSIS — M545 Low back pain, unspecified: Secondary | ICD-10-CM | POA: Diagnosis not present

## 2024-09-08 DIAGNOSIS — Z2821 Immunization not carried out because of patient refusal: Secondary | ICD-10-CM | POA: Diagnosis not present

## 2024-09-08 DIAGNOSIS — G8929 Other chronic pain: Secondary | ICD-10-CM | POA: Diagnosis not present

## 2024-09-15 ENCOUNTER — Ambulatory Visit (HOSPITAL_COMMUNITY)

## 2024-09-15 ENCOUNTER — Other Ambulatory Visit (HOSPITAL_COMMUNITY)

## 2024-10-15 DIAGNOSIS — E782 Mixed hyperlipidemia: Secondary | ICD-10-CM | POA: Diagnosis not present

## 2024-10-15 DIAGNOSIS — F419 Anxiety disorder, unspecified: Secondary | ICD-10-CM | POA: Diagnosis not present

## 2024-10-21 DIAGNOSIS — M25562 Pain in left knee: Secondary | ICD-10-CM | POA: Diagnosis not present

## 2024-10-21 DIAGNOSIS — M545 Low back pain, unspecified: Secondary | ICD-10-CM | POA: Diagnosis not present

## 2024-10-21 DIAGNOSIS — G8929 Other chronic pain: Secondary | ICD-10-CM | POA: Diagnosis not present

## 2024-10-21 DIAGNOSIS — M25561 Pain in right knee: Secondary | ICD-10-CM | POA: Diagnosis not present

## 2024-10-21 DIAGNOSIS — F419 Anxiety disorder, unspecified: Secondary | ICD-10-CM | POA: Diagnosis not present

## 2024-10-21 DIAGNOSIS — E782 Mixed hyperlipidemia: Secondary | ICD-10-CM | POA: Diagnosis not present

## 2024-10-21 DIAGNOSIS — R609 Edema, unspecified: Secondary | ICD-10-CM | POA: Diagnosis not present

## 2024-12-12 ENCOUNTER — Ambulatory Visit: Admitting: Orthopedic Surgery

## 2024-12-12 DIAGNOSIS — M170 Bilateral primary osteoarthritis of knee: Secondary | ICD-10-CM

## 2024-12-12 DIAGNOSIS — G8929 Other chronic pain: Secondary | ICD-10-CM

## 2024-12-12 NOTE — Progress Notes (Unsigned)
 New Patient Visit  Summary: [name] is a 79 y.o. female with the following: Chronic pain of bilateral knees   Assessment & Plan Bilateral knee osteoarthritis with effusion Chronic bilateral knee osteoarthritis with recurrent effusions, left knee more severe. Effusions due to increased synovial fluid from arthritic changes. Previously responded to arthrocentesis and intra-articular corticosteroids. Avoided oral corticosteroids to limit exposure. - Performed bilateral knee arthrocentesis. - Advised continuation of ibuprofen  as needed for analgesia. - Instructed to contact office for additional medicines if needed.   Procedure note - aspiration and injection right knee joint   Verbal consent was obtained to aspirate the right knee joint  Timeout was completed to confirm the site of aspiration. The skin was prepped with alcohol and ethyl chloride was sprayed at the aspiration site.  An 18-gauge needle was inserted and 16 cc of clear joint fluid was aspirated using a superolateral approach.  Using the same needle, 40 mg of Depo-Medrol  and 4 cc of 1% lidocaine  was injected The patient tolerated the procedure well. There were no complications. A sterile bandage was applied.  Procedure note - aspiration and injection left knee joint   Verbal consent was obtained to aspirate the left knee joint  Timeout was completed to confirm the site of aspiration. The skin was prepped with alcohol and ethyl chloride was sprayed at the aspiration site.  An 18-gauge needle was inserted and 6 cc of clear joint fluid was aspirated using a superolateral approach.  Using the same needle, 40 mg of Depo-Medrol  and 4 cc of 1% lidocaine  was injected The patient tolerated the procedure well. There were no complications. A sterile bandage was applied.   Follow-up: Return if symptoms worsen or fail to improve.  Subjective:  Chief Complaint  Patient presents with   Injections    Bilat knees, also  feels like they are swollen.  NDC: [phone]     Discussed the use of AI scribe software for clinical note transcription with the patient, who gave verbal consent to proceed.  History of Present Illness [name] is a 79 year old female with bilateral knee osteoarthritis who presents for evaluation and management of recurrent bilateral knee pain and swelling.  She has had chronic bilateral knee pain and swelling since 2012, with pain worst in the posterior left knee. Pain is aggravated by descending stairs, and stiffness and swelling limit flexion so that during flares she must lift her leg with her hands to get into a car. She describes the knees as enlarged and states that swelling makes it difficult to bend and move.  Recurrent knee effusions have been treated with aspiration and intra-articular corticosteroid injections, with more fluid typically removed from the left knee and good but temporary relief before pain and effusions recur.  She uses ibuprofen  as needed and has used short courses of prednisone  for severe flares but is not taking prednisone  now and does not need additional ibuprofen  today. She prefers to avoid frequent procedures and seeks care only when symptoms become severe, though current pain and swelling significantly limit her mobility.    Review of Systems: No fevers or chills No numbness or tingling No chest pain No shortness of breath No bowel or bladder dysfunction No GI distress No headaches   Medical History:  Past Medical History:  Diagnosis Date   Anxiety    Cataract    Depression    GERD (gastroesophageal reflux disease)    Hyperlipidemia    Hypothyroidism  Past Surgical History:  Procedure Laterality Date   ABDOMINAL HYSTERECTOMY     CATARACT EXTRACTION     COLONOSCOPY N/A 09/02/2015   Procedure: COLONOSCOPY;  Surgeon: Claudis RAYMOND Rivet, MD;  Location: [facility];  Service: Endoscopy;  Laterality: N/A;  930   COLONOSCOPY WITH  PROPOFOL  N/A 11/24/2020   Procedure: COLONOSCOPY WITH PROPOFOL ;  Surgeon: Rivet Claudis RAYMOND, MD;  Location: [facility];  Service: Endoscopy;  Laterality: N/A;  10:00   HOT HEMOSTASIS  11/24/2020   Procedure: HOT HEMOSTASIS (ARGON PLASMA COAGULATION/BICAP);  Surgeon: Rivet Claudis RAYMOND, MD;  Location: [facility];  Service: Endoscopy;;   KNEE SURGERY     POLYPECTOMY  11/24/2020   Procedure: POLYPECTOMY;  Surgeon: Rivet Claudis RAYMOND, MD;  Location: [facility];  Service: Endoscopy;;    Family History  Problem Relation Age of Onset   Cancer Mother        lung   Stroke Father    Heart disease Brother    Social History[1]  Allergies[2]  Active Medications[3]  Objective: There were no vitals taken for this visit.  Physical Exam:    General: Alert and oriented. and No acute distress. Gait: Slow, steady gait.  Physical Exam MUSCULOSKELETAL: Left knee effusion present. Valgus deformity of the knee. Arthritis in the knee. Right knee effusion present.  Good ROM bilaterally.  No laxity to varus or valgus stress.  Negative Lachman   IMAGING: I personally reviewed images previously obtained in clinic  Bilateral knee arthritis.  Valgus alignment of right knee.  Neutral left knee.      New Medications:  No orders of the defined types were placed in this encounter.     Portions of this note were completed via Scientist, clinical (histocompatibility and immunogenetics).  Oneil DELENA Horde, MD  12/14/2024 11:23 PM      [1]  Social History Tobacco Use   Smoking status: Never   Smokeless tobacco: Never  Vaping Use   Vaping status: Never Used  Substance Use Topics   Alcohol use: No   Drug use: No  [2]  Allergies Allergen Reactions   Buspirone Other (See Comments)   Lipitor [Atorvastatin Calcium] Other (See Comments)    Reaction:  Leg pain    Meloxicam Other (See Comments)  [3]  No outpatient medications have been marked as taking for the 12/12/24 encounter (Office Visit) with Horde Oneil DELENA, MD.    Current Facility-Administered Medications for the 12/12/24 encounter (Office Visit) with Horde Oneil DELENA, MD  Medication   methylPREDNISolone  acetate (DEPO-MEDROL ) injection 40 mg   methylPREDNISolone  acetate (DEPO-MEDROL ) injection 40 mg

## 2024-12-12 NOTE — Patient Instructions (Signed)

## 2024-12-14 ENCOUNTER — Encounter: Payer: Self-pay | Admitting: Orthopedic Surgery
